# Patient Record
Sex: Male | Born: 1945 | Race: White | Hispanic: No | Marital: Married | Smoking: Never smoker
Health system: Southern US, Community
[De-identification: ages and names within clinical notes are randomized; demographics above are authoritative.]

## PROBLEM LIST (undated history)

## (undated) DIAGNOSIS — E785 Hyperlipidemia, unspecified: Secondary | ICD-10-CM

## (undated) DIAGNOSIS — R079 Chest pain, unspecified: Secondary | ICD-10-CM

## (undated) DIAGNOSIS — Z978 Presence of other specified devices: Secondary | ICD-10-CM

## (undated) DIAGNOSIS — J189 Pneumonia, unspecified organism: Secondary | ICD-10-CM

## (undated) DIAGNOSIS — F909 Attention-deficit hyperactivity disorder, unspecified type: Secondary | ICD-10-CM

## (undated) DIAGNOSIS — Z960 Presence of urogenital implants: Secondary | ICD-10-CM

## (undated) DIAGNOSIS — I2510 Atherosclerotic heart disease of native coronary artery without angina pectoris: Secondary | ICD-10-CM

## (undated) HISTORY — DX: Hyperlipidemia, unspecified: E78.5

## (undated) HISTORY — PX: CARDIAC CATHETERIZATION: SHX172

## (undated) HISTORY — DX: Atherosclerotic heart disease of native coronary artery without angina pectoris: I25.10

## (undated) HISTORY — DX: Chest pain, unspecified: R07.9

## (undated) HISTORY — PX: HERNIA REPAIR: SHX51

## (undated) HISTORY — DX: Attention-deficit hyperactivity disorder, unspecified type: F90.9

---

## 2010-08-18 ENCOUNTER — Ambulatory Visit (HOSPITAL_COMMUNITY)
Admission: RE | Admit: 2010-08-18 | Discharge: 2010-08-18 | Payer: Self-pay | Source: Home / Self Care | Attending: General Surgery | Admitting: General Surgery

## 2011-10-30 ENCOUNTER — Observation Stay (HOSPITAL_COMMUNITY)
Admission: EM | Admit: 2011-10-30 | Discharge: 2011-11-01 | DRG: 124 | Disposition: A | Discharge status: Home / Self Care | Payer: BC Managed Care – PPO | Attending: Emergency Medicine | Admitting: Emergency Medicine

## 2011-10-30 ENCOUNTER — Other Ambulatory Visit: Payer: Self-pay

## 2011-10-30 ENCOUNTER — Emergency Department (HOSPITAL_COMMUNITY): Payer: BC Managed Care – PPO

## 2011-10-30 ENCOUNTER — Encounter (HOSPITAL_COMMUNITY): Payer: Self-pay | Admitting: *Deleted

## 2011-10-30 DIAGNOSIS — I200 Unstable angina: Secondary | ICD-10-CM | POA: Insufficient documentation

## 2011-10-30 DIAGNOSIS — I2510 Atherosclerotic heart disease of native coronary artery without angina pectoris: Principal | ICD-10-CM | POA: Diagnosis present

## 2011-10-30 DIAGNOSIS — Z8249 Family history of ischemic heart disease and other diseases of the circulatory system: Secondary | ICD-10-CM | POA: Insufficient documentation

## 2011-10-30 DIAGNOSIS — R079 Chest pain, unspecified: Secondary | ICD-10-CM | POA: Insufficient documentation

## 2011-10-30 DIAGNOSIS — Z79899 Other long term (current) drug therapy: Secondary | ICD-10-CM | POA: Insufficient documentation

## 2011-10-30 DIAGNOSIS — E785 Hyperlipidemia, unspecified: Secondary | ICD-10-CM | POA: Diagnosis present

## 2011-10-30 DIAGNOSIS — R0602 Shortness of breath: Secondary | ICD-10-CM | POA: Insufficient documentation

## 2011-10-30 LAB — DIFFERENTIAL
Basophils Relative: 1 % (ref 0–1)
Eosinophils Absolute: 0.2 10*3/uL (ref 0.0–0.7)
Monocytes Absolute: 0.5 10*3/uL (ref 0.1–1.0)
Monocytes Relative: 7 % (ref 3–12)

## 2011-10-30 LAB — CBC
HCT: 42.3 % (ref 39.0–52.0)
Hemoglobin: 14.8 g/dL (ref 13.0–17.0)
MCH: 29.8 pg (ref 26.0–34.0)
MCHC: 35 g/dL (ref 30.0–36.0)
MCV: 85.3 fL (ref 78.0–100.0)

## 2011-10-30 LAB — POCT I-STAT TROPONIN I

## 2011-10-30 LAB — CK TOTAL AND CKMB (NOT AT ARMC): Relative Index: 2.1 (ref 0.0–2.5)

## 2011-10-30 MED ORDER — NITROGLYCERIN 0.4 MG SL SUBL
0.4000 mg | SUBLINGUAL_TABLET | SUBLINGUAL | Status: DC | PRN
  Administered 2011-10-30 – 2011-10-31 (×2): 0.4 mg via SUBLINGUAL
  Filled 2011-10-30: qty 25
  Filled 2011-10-30: qty 75

## 2011-10-30 NOTE — ED Provider Notes (Signed)
History     CSN: [id number]  Arrival date & time 10/30/11  2233   First MD Initiated Contact with Patient 10/30/11 2259      Chief Complaint  Patient presents with  . Chest Pain     Patient is a 66 y.o. male presenting with chest pain. The history is provided by the patient.  Chest Pain The chest pain began 3 - 5 hours ago. Chest pain occurs constantly. The chest pain is improving. Associated with: nothing. The severity of the pain is moderate. The quality of the pain is described as pressure-like. The pain radiates to the upper back. Chest pain is worsened by certain positions. Primary symptoms include shortness of breath. Pertinent negatives for primary symptoms include no fever, no syncope, no abdominal pain, no nausea, no vomiting and no dizziness.   pt reports onset of chest pressure/SOB earlier No syncope/focal weakness.  No abd pain No diaphoresis/nausea/vomiting No h/o CAD No recent travel/surgery He is very active at baseline and never limited by chest pain He is a nonsmoker Family h/o CAD He takes meds for ADHD but other medical problems   PMH -ADHD  History reviewed. No pertinent past surgical history.  Family history - positive for CAD  History  Substance Use Topics  . Smoking status: Never Smoker   . Smokeless tobacco: Not on file  . Alcohol Use: No      Review of Systems  Constitutional: Negative for fever.  Respiratory: Positive for shortness of breath.   Cardiovascular: Positive for chest pain. Negative for syncope.  Gastrointestinal: Negative for nausea, vomiting and abdominal pain.  Neurological: Negative for dizziness.  All other systems reviewed and are negative.    Allergies  Flexeril  Home Medications   Current Outpatient Rx  Name Route Sig Dispense Refill  . ASPIRIN 81 MG PO CHEW Oral Chew 81 mg by mouth daily.    . METHYLPHENIDATE HCL ER (CD) 20 MG PO CPCR Oral Take 20 mg by mouth daily.    . ADULT MULTIVITAMIN W/MINERALS CH Oral  Take 1 tablet by mouth daily.    . OMEGA-3-ACID ETHYL ESTERS 1 G PO CAPS Oral Take 1 g by mouth daily.      BP 122/76  Pulse 57  Temp(Src) 97.7 F (36.5 C) (Oral)  Resp 11  SpO2 100%  Physical Exam CONSTITUTIONAL: Well developed/well nourished HEAD AND FACE: Normocephalic/atraumatic EYES: EOMI/PERRL ENMT: Mucous membranes moist NECK: supple no meningeal signs SPINE:entire spine nontender CV: S1/S2 noted, no murmurs/rubs/gallops noted LUNGS: Lungs are clear to auscultation bilaterally, no apparent distress ABDOMEN: soft, nontender, no rebound or guarding GU:no cva tenderness NEURO: Pt is awake/alert, moves all extremitiesx4 EXTREMITIES: pulses normal, full ROM SKIN: warm, color normal PSYCH: no abnormalities of mood noted  ED Course  Procedures    Labs Reviewed  CBC  DIFFERENTIAL  POCT I-STAT TROPONIN I  CK TOTAL AND CKMB   11:32 PM Pt with chest pressure that is improving but still present He has already taken ASA today Will give NTG and reassess Vitals to be repeated  12:22 AM Repeat EKG shows no significant change  12:40 AM Pt reports pain improved with one dose of NTG He appears well  EKG nondiagnostic Initial troponin negative Did not report pleuritic type pain, doubt PE/dissection Will place in CDU obs for plan to Stress in AM Pt agreeable  MDM  Nursing notes reviewed and considered in documentation All labs/vitals reviewed and considered xrays reviewed and considered  Date: 10/30/2011  Rate: 65  Rhythm: normal sinus rhythm  QRS Axis: left  Intervals: normal  ST/T Wave abnormalities: nonspecific ST changes  Conduction Disutrbances:none Poor quality/artifact noted   Joya Gaskins, MD 10/31/11 [phone]

## 2011-10-30 NOTE — ED Notes (Signed)
The pt has had mid-chest pain  For 3 hours with some sob.  He says it feels like a weight pressing on his chest..  No previous history

## 2011-10-31 ENCOUNTER — Observation Stay (HOSPITAL_COMMUNITY): Payer: BC Managed Care – PPO

## 2011-10-31 ENCOUNTER — Encounter (HOSPITAL_COMMUNITY)
Admission: EM | Disposition: A | Discharge status: Home / Self Care | Payer: Self-pay | Source: Home / Self Care | Attending: Emergency Medicine

## 2011-10-31 ENCOUNTER — Other Ambulatory Visit: Payer: Self-pay

## 2011-10-31 DIAGNOSIS — I2510 Atherosclerotic heart disease of native coronary artery without angina pectoris: Secondary | ICD-10-CM | POA: Diagnosis present

## 2011-10-31 HISTORY — PX: LEFT HEART CATHETERIZATION WITH CORONARY ANGIOGRAM: SHX5451

## 2011-10-31 LAB — BASIC METABOLIC PANEL
CO2: 22 mEq/L (ref 19–32)
GFR calc non Af Amer: 89 mL/min — ABNORMAL LOW (ref >90)
Glucose, Bld: 116 mg/dL — ABNORMAL HIGH (ref 70–99)
Potassium: 4.2 mEq/L (ref 3.5–5.1)
Sodium: 139 mEq/L (ref 135–145)

## 2011-10-31 LAB — POCT I-STAT TROPONIN I: Troponin i, poc: 0 ng/mL (ref 0.00–0.08)

## 2011-10-31 SURGERY — LEFT HEART CATHETERIZATION WITH CORONARY ANGIOGRAM
Anesthesia: LOCAL

## 2011-10-31 MED ORDER — SODIUM CHLORIDE 0.9 % IV SOLN: 1.0000 mL/kg/h | INTRAVENOUS | Status: DC

## 2011-10-31 MED ORDER — IOHEXOL 350 MG/ML SOLN
80.0000 mL | Freq: Once | INTRAVENOUS | Status: AC | PRN
  Administered 2011-10-31: 80 mL via INTRAVENOUS

## 2011-10-31 MED ORDER — NITROGLYCERIN 0.4 MG SL SUBL
SUBLINGUAL_TABLET | SUBLINGUAL | Status: AC
  Filled 2011-10-31: qty 25

## 2011-10-31 MED ORDER — METHYLPHENIDATE HCL ER (CD) 20 MG PO CPCR
20.0000 mg | ORAL_CAPSULE | Freq: Every day | ORAL | Status: DC
  Filled 2011-10-31 (×2): qty 1

## 2011-10-31 MED ORDER — SODIUM CHLORIDE 0.9 % IV SOLN: INTRAVENOUS | Status: AC

## 2011-10-31 MED ORDER — SODIUM CHLORIDE 0.9 % IJ SOLN
3.0000 mL | Freq: Two times a day (BID) | INTRAMUSCULAR | Status: DC
  Administered 2011-10-31: 3 mL via INTRAVENOUS

## 2011-10-31 MED ORDER — ONDANSETRON HCL 4 MG/2ML IJ SOLN: 4.0000 mg | Freq: Four times a day (QID) | INTRAMUSCULAR | Status: DC | PRN

## 2011-10-31 MED ORDER — ADULT MULTIVITAMIN W/MINERALS CH
1.0000 | ORAL_TABLET | Freq: Every day | ORAL | Status: DC
  Administered 2011-10-31 – 2011-11-01 (×2): 1 via ORAL
  Filled 2011-10-31 (×2): qty 1

## 2011-10-31 MED ORDER — SODIUM CHLORIDE 0.9 % IV SOLN: INTRAVENOUS | Status: DC

## 2011-10-31 MED ORDER — ATORVASTATIN CALCIUM 20 MG PO TABS
20.0000 mg | ORAL_TABLET | Freq: Every day | ORAL | Status: DC
  Filled 2011-10-31: qty 1

## 2011-10-31 MED ORDER — MIDAZOLAM HCL 2 MG/2ML IJ SOLN
INTRAMUSCULAR | Status: AC
  Filled 2011-10-31: qty 2

## 2011-10-31 MED ORDER — ASPIRIN EC 325 MG PO TBEC
325.0000 mg | DELAYED_RELEASE_TABLET | Freq: Every day | ORAL | Status: DC
  Administered 2011-11-01: 325 mg via ORAL
  Filled 2011-10-31: qty 1

## 2011-10-31 MED ORDER — METOPROLOL TARTRATE 1 MG/ML IV SOLN
INTRAVENOUS | Status: AC
  Filled 2011-10-31: qty 10

## 2011-10-31 MED ORDER — ALPRAZOLAM 0.25 MG PO TABS: 0.2500 mg | ORAL_TABLET | Freq: Two times a day (BID) | ORAL | Status: DC | PRN

## 2011-10-31 MED ORDER — CLOPIDOGREL BISULFATE 75 MG PO TABS
150.0000 mg | ORAL_TABLET | Freq: Once | ORAL | Status: AC
Start: 2011-10-31 — End: 2011-10-31
  Administered 2011-10-31: 150 mg via ORAL
  Filled 2011-10-31: qty 2

## 2011-10-31 MED ORDER — METOPROLOL TARTRATE 1 MG/ML IV SOLN
2.5000 mg | Freq: Once | INTRAVENOUS | Status: AC
  Administered 2011-10-31: 2.5 mg via INTRAVENOUS

## 2011-10-31 MED ORDER — DIPHENHYDRAMINE HCL 25 MG PO CAPS
25.0000 mg | ORAL_CAPSULE | Freq: Once | ORAL | Status: AC
  Administered 2011-10-31: 25 mg via ORAL
  Filled 2011-10-31: qty 1

## 2011-10-31 MED ORDER — FENTANYL CITRATE 0.05 MG/ML IJ SOLN
INTRAMUSCULAR | Status: AC
  Filled 2011-10-31: qty 2

## 2011-10-31 MED ORDER — SODIUM CHLORIDE 0.9 % IJ SOLN: 3.0000 mL | INTRAMUSCULAR | Status: DC | PRN

## 2011-10-31 MED ORDER — ACETAMINOPHEN 325 MG PO TABS: 650.0000 mg | ORAL_TABLET | ORAL | Status: DC | PRN

## 2011-10-31 MED ORDER — OMEGA-3-ACID ETHYL ESTERS 1 G PO CAPS
1.0000 g | ORAL_CAPSULE | Freq: Every day | ORAL | Status: DC
  Administered 2011-10-31 – 2011-11-01 (×2): 1 g via ORAL
  Filled 2011-10-31 (×2): qty 1

## 2011-10-31 MED ORDER — ASPIRIN 81 MG PO CHEW: 324.0000 mg | CHEWABLE_TABLET | Freq: Once | ORAL | Status: DC

## 2011-10-31 MED ORDER — CLOPIDOGREL BISULFATE 75 MG PO TABS
75.0000 mg | ORAL_TABLET | Freq: Every day | ORAL | Status: DC
  Administered 2011-11-01: 75 mg via ORAL
  Filled 2011-10-31: qty 1

## 2011-10-31 MED ORDER — NITROGLYCERIN 0.4 MG SL SUBL
0.4000 mg | SUBLINGUAL_TABLET | Freq: Once | SUBLINGUAL | Status: AC
  Administered 2011-10-31: 0.4 mg via SUBLINGUAL

## 2011-10-31 MED ORDER — ASPIRIN 81 MG PO CHEW: 324.0000 mg | CHEWABLE_TABLET | ORAL | Status: DC

## 2011-10-31 MED ORDER — ASPIRIN 81 MG PO CHEW
CHEWABLE_TABLET | ORAL | Status: AC
  Filled 2011-10-31: qty 4

## 2011-10-31 MED ORDER — SODIUM CHLORIDE 0.9 % IV SOLN
20.0000 mL | INTRAVENOUS | Status: DC
  Administered 2011-10-31: 20 mL via INTRAVENOUS

## 2011-10-31 MED ORDER — SODIUM CHLORIDE 0.9 % IV SOLN: 250.0000 mL | INTRAVENOUS | Status: DC | PRN

## 2011-10-31 NOTE — ED Notes (Signed)
IN TO SPEAK WITH PT AND HIS WIFE. THEY ARE WANTING TO KNOW WHEN HE WILL BE DISCHARGED. SPENT SOME TIME AT BEDSIDE EXPLAINING WE ARE WAITING FOR CARDIOLOGY TO CALL BACK . REINFORCED PT DOES HAVE SOME BLOCKAGES FOUND ON HIS CT AND WE ARE WANTING TO PROVIDE THE SAFEST CARE FOR PT. THEY VERBALIZE UNDERSTANDING. PT ALSO MEDICATED FOR A "FUNNY FEELING" IN HIS CHEST. STATES "I JUST KNOW ITS THERE".

## 2011-10-31 NOTE — ED Notes (Signed)
DR TALBOT HAS CALLED REPORT ON PT. PT HAS SIGNIFICANT DISEASE IN LAD. AND WILL REQUIRE CARDIOLOGY CONSULT.DR Effie Shy MADE AWARE

## 2011-10-31 NOTE — ED Notes (Signed)
Pt states he took a quantity of two, 325 mg ASA PTA. Discontinued order for 324 mg chewable ASA.

## 2011-10-31 NOTE — H&P (Signed)
Terry Snyder is a 66 y.o. male  Admit date: 10/30/2011 Referring Physician: Dr. Beverley Fiedler at [facility] Primary Cardiologist:: Gwynneth Albright, M.D. Chief complaint / reason for admission: Chest pressure  HPI: The patient is a very pleasant 66 year old gentleman with no significant post factors for coronary disease other than family history. His mother several uncles and lives and his grandfather all had premature atherosclerosis. He is an active gentleman. He plays tennis several times per week. Last evening he suddenly developed a pressure-like sensation in the substernal area. It waxed and waned. He eventually came to the emergency room where the initial EKG was unremarkable. Sublingual nitroglycerin completely resolved the discomfort. The discomfort recurreed on 2 other occasions while in the emergency room and each time nitroglycerin promptly relieved it. He was observed in the CDU where cardiac markers were negative. A CT coronary angiogram was performed demonstrating a calcium score of 385, moderate soft plaque in the mid LAD greater than and equal to 50%, and a least 50% in the proximal segment of the RCA. The patient's electrocardiogram reveals poor R. wave progression V1 through V4. Several sets of cardiac markers have been negative.    PMH:   History reviewed. No pertinent past medical history.  PSH:   History reviewed. No pertinent past surgical history.  ALLERGIES:   Flexeril  Prior to Admit Meds:   Prescriptions prior to admission  Medication Sig Dispense Refill  . aspirin 81 MG chewable tablet Chew 81 mg by mouth daily.      . methylphenidate (METADATE CD) 20 MG CR capsule Take 20 mg by mouth daily.      . Multiple Vitamin (MULITIVITAMIN WITH MINERALS) TABS Take 1 tablet by mouth daily.      Marland Kitchen omega-3 acid ethyl esters (LOVAZA) 1 G capsule Take 1 g by mouth daily.       Family HX:   History reviewed. No pertinent family history. Social HX:    History   Social  History  . Marital Status: Married    Spouse Name: N/A    Number of Children: N/A  . Years of Education: N/A   Occupational History  . Not on file.   Social History Main Topics  . Smoking status: Never Smoker   . Smokeless tobacco: Not on file  . Alcohol Use: No  . Drug Use:   . Sexually Active:    Other Topics Concern  . Not on file   Social History Narrative  . No narrative on file     ROS: The patient denies history of coronary disease. He is active with no physical limitations. He denies asthma, COPD, smoking, diabetes, hypertension, and peripheral vascular disease. No history of gastrointestinal disease. He denies reflux. No prior stroke or transient neurological symptoms.  Physical Exam: Blood pressure 124/80, pulse 56, temperature 97.7 F (36.5 C), temperature source Oral, resp. rate 19, height 6\' 5"  (1.956 m), weight 89.812 kg (198 lb), SpO2 98.00%.    The patient is well-developed and well-nourished. He is in no acute distress.  The skin is of normal color. There is no diaphoresis. No evidence of cyanosis is noted.  The neck exam reveals no JVD or carotid bruits.  The chest is clear to auscultation and percussion.  Cardiac exam is unremarkable. No murmur, rub, click, or gallop is heard.  Abdomen is soft. No bruits are heard. Liver and spleen not palpable.  Extremities reveal no edema. Radial pulses are 2+ and symmetric.  Posterior tibials are 2+. Femorals are 2+ and symmetric.  Neurological exam is intact. Labs:   Lab Results  Component Value Date   WBC 7.3 10/30/2011   HGB 14.8 10/30/2011   HCT 42.3 10/30/2011   MCV 85.3 10/30/2011   PLT 205 10/30/2011    Lab 10/30/11 2246  NA 139  K 4.2  CL 104  CO2 22  BUN 20  CREATININE 0.89  CALCIUM 8.9  PROT --  BILITOT --  ALKPHOS --  ALT --  AST --  GLUCOSE 116*   Lab Results  Component Value Date   CKTOTAL 160 10/30/2011   CKMB 3.4 10/30/2011     Radiology:  No acute abnormality noted. EKG:  Poor  R. wave progression. Normal sinus rhythm.  ASSESSMENT:   1. Unstable angina pectoris, no evidence of myocardial necrosis. Documentation of coronary disease by CT angio with both LAD and RCA calcium and soft plaque.  Plan:  1. After review of the patient's clinical data and examining him, I believe coronary angiography is the preferred next step. His history is compatible with unstable angina with multiple episodes of chest pain relieved by sublingual nitroglycerin. I spoke with him concerning the procedure including the risks. We discussed in depth, microinfarction, CABG, stroke, bleeding, allergy, ischemic limb, renal failure, among others. He is willing to proceed with this procedure to define anatomy and help guide therapy to her Lesleigh Noe 10/31/2011 3:26 PM

## 2011-10-31 NOTE — ED Provider Notes (Signed)
Medical screening examination/treatment/procedure(s) were performed by non-physician practitioner and as supervising physician I was immediately available for consultation/collaboration.  Flint Melter, MD 10/31/11 [phone]

## 2011-10-31 NOTE — ED Notes (Signed)
PT STATES THE SL NTG HAS DIMINISHED HIS PAIN. STATES IS BARELY THERE. DENIES SOB

## 2011-10-31 NOTE — ED Notes (Signed)
Pt did have # 2o in left ac. Was not previously documented. Site discontinued per pt request. Site asymptomatic.

## 2011-10-31 NOTE — ED Notes (Signed)
Spoke with dr Reche Dixon. No po metoprolol orders given . Will get pt to ct when staff ready for  pt

## 2011-10-31 NOTE — CV Procedure (Signed)
     Diagnostic Cardiac Catheterization Report  Terry Snyder  65 y.o.  male 03/18/1946  Procedure Date: 10/31/2011  Referring Physician: Dr. Landis Gandy Primary Cardiologist:: Wayne Both, III M.D.   PROCEDURE:  Left heart catheterization with selective coronary angiography, left ventriculogram.  INDICATIONS:  Unstable angina, abnormal CT angiogram with calcium a soft plaque in the LAD and RCA territory, and at least 3 episodes of chest pressure responsive to sublingual nitroglycerin  The risks, benefits, and details of the procedure were explained to the patient.  The patient verbalized understanding and wanted to proceed.  Informed written consent was obtained.  PROCEDURE TECHNIQUE:  After Xylocaine anesthesia a 5 French sheath was placed in the right radial artery with a single anterior needle wall stick.   Coronary angiography was done using a 5 [place] A2 MP, 5 [place] JR 4, and 5 [place] JL 3.5 catheters.  Left ventriculography was done using a 5 [place] A2 MP catheter.    CONTRAST:  Total of 125 cc.  COMPLICATIONS:  None.    HEMODYNAMICS:  Aortic pressure was 102/60 mmHg; LV pressure was 108/4 mmHg; LVEDP 13 mmHg.  There was no gradient between the left ventricle and aorta.    ANGIOGRAPHIC DATA:   The left main coronary artery is widely patent..  The left anterior descending artery is large transapical vessel. It gives origin to 2 diagonal branches. Moderate proximal and mid vessel plaquing is noted obstructing the vessel up to 50%. No high grade obstruction is noted..  The left circumflex artery is dominant. 5 obtuse marginal branches arise from this vessel. It also gives origin to the PDA. The first, third, and fifth obtuse marginals are the larger branches. The fifth obtuse marginal contains eccentric, hazy, 50-60% narrowing due to probable ruptured plaque. This vessel distribution is relatively small. TIMI grade 3 flow was noted..  The right coronary artery is  non-dominant/codominant with a large conus branch arising proximally followed by a small to moderate-sized vessel with tubular 50-70% mid vessel stenosis. The right coronary gives a terminal acute marginal branch to the right ventricle.Marland Kitchen  LEFT VENTRICULOGRAM:  Left ventricular angiogram was done in the 30 RAO projection and revealed normal left ventricular wall motion and systolic function with an estimated ejection fraction of 65 %.    IMPRESSIONS:  1. Unstable angina, likely due to plaque rupture in the fifth obtuse marginal branch. The vessel size and borderline obstruction suggested medical therapy should be the treatment of choice. 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 The right coronary could also be the source of the patient's pain. This vessel is also relatively small and is probably better treated with medical therapy.  2. Mild to moderate plaquing in the LAD proximal and mid segment  3. Normal LV function, with LVEF of 65%   RECOMMENDATION:  1. Risk factor modification including statin therapy, aspirin, and Plavix (30 days).  2. Sublingual nitroglycerin if recurrent chest pain.  3. Probable discharge home in 12-24 hours depending upon his clinical course.Marland Kitchen

## 2011-10-31 NOTE — ED Notes (Signed)
Pt has returned accompanied by rn from ct. Tolerated meds and procedure well.

## 2011-10-31 NOTE — ED Notes (Signed)
Report given to [place] and [place] RN in CDU.

## 2011-10-31 NOTE — ED Notes (Signed)
Pt states that his pressure has been relieved. Pt aware of nitro order and told to call if pressure returns or changes.

## 2011-10-31 NOTE — ED Provider Notes (Signed)
Patient in CDU under chest pain protocol.  Patient resting comfortably at present with family at bedside, but continues to report mild sub-sternal pressure.  CT coronary results reviewed and discussed with patient and with Dr. Effie Shy.  LAD with approximately 50% stenosis.  All vessels patent.  Will administer NTG for discomfort and consult with cardiology.  Jimmye Norman, NP 10/31/11 [phone]

## 2011-10-31 NOTE — ED Notes (Signed)
Dr Katrinka Blazing in the cath lab.

## 2011-10-31 NOTE — ED Provider Notes (Signed)
Patient has been seen and evaluated by Dr. Erskine Emery Caeson Filippi--patient will be taken to cath lab today.  Jimmye Norman, NP 10/31/11 1505

## 2011-10-31 NOTE — Progress Notes (Signed)
Observation review is complete. 

## 2011-10-31 NOTE — ED Provider Notes (Signed)
Medical screening examination/treatment/procedure(s) were performed by non-physician practitioner and as supervising physician I was immediately available for consultation/collaboration.  Flint Melter, MD 10/31/11 1721

## 2011-10-31 NOTE — ED Notes (Signed)
Pt states that he was having Chest pressure that started radiating to his back then at rest went away. Pt states pressure felt like it was a 3/10. Pt states some nausea and SOB with pressure. Pt took 324 mg of aspirin at home.

## 2011-10-31 NOTE — ED Notes (Signed)
NP IN TO DISCUSS TEST RESULTS WITH PT AND PLAN OF CARE

## 2011-11-01 ENCOUNTER — Encounter (HOSPITAL_COMMUNITY): Payer: Self-pay | Admitting: General Practice

## 2011-11-01 DIAGNOSIS — E785 Hyperlipidemia, unspecified: Secondary | ICD-10-CM | POA: Diagnosis present

## 2011-11-01 LAB — BASIC METABOLIC PANEL
BUN: 12 mg/dL (ref 6–23)
GFR calc Af Amer: 89 mL/min — ABNORMAL LOW (ref >90)
GFR calc non Af Amer: 77 mL/min — ABNORMAL LOW (ref >90)
Potassium: 3.9 mEq/L (ref 3.5–5.1)
Sodium: 140 mEq/L (ref 135–145)

## 2011-11-01 LAB — CARDIAC PANEL(CRET KIN+CKTOT+MB+TROPI): Troponin I: <0.3 ng/mL (ref <0.30)

## 2011-11-01 MED ORDER — ATORVASTATIN CALCIUM 20 MG PO TABS: 10.0000 mg | ORAL_TABLET | Freq: Every day | ORAL | Status: AC

## 2011-11-01 MED ORDER — AMLODIPINE BESYLATE 5 MG PO TABS
5.0000 mg | ORAL_TABLET | Freq: Every day | ORAL | Status: DC
  Filled 2011-11-01: qty 1

## 2011-11-01 MED ORDER — AMLODIPINE BESYLATE 5 MG PO TABS: 5.0000 mg | ORAL_TABLET | Freq: Every day | ORAL | Status: AC

## 2011-11-01 MED ORDER — ASPIRIN 325 MG PO TBEC: 325.0000 mg | DELAYED_RELEASE_TABLET | Freq: Every day | ORAL | Status: AC

## 2011-11-01 MED ORDER — NITROGLYCERIN 0.4 MG SL SUBL: 0.4000 mg | SUBLINGUAL_TABLET | SUBLINGUAL | Status: AC | PRN

## 2011-11-01 MED ORDER — CLOPIDOGREL BISULFATE 75 MG PO TABS: 75.0000 mg | ORAL_TABLET | Freq: Every day | ORAL | Status: AC

## 2011-11-01 MED ORDER — OMEGA-3-ACID ETHYL ESTERS 1 G PO CAPS: 1.0000 g | ORAL_CAPSULE | Freq: Two times a day (BID) | ORAL | Status: AC

## 2011-11-01 NOTE — Progress Notes (Signed)
D/c instructions reviewed with pt and wife by RN Iran Kievit [place]. Pt and family verbalized understanding of all instructions. Education provided. Pt instructed to call Dr Michaelle Copas office if symptoms that he had this am return. Pt given copy of instructions and scripts with norvasc script was called into pt's pharmacy by Dr Katrinka Blazing. Pt d/c'd via wheelchair with belongings with wife escorted by hospital volunteer.

## 2011-11-01 NOTE — Progress Notes (Signed)
CARDIAC REHAB PHASE I   PRE:  Rate/Rhythm: 61 SR    BP: sitting 156/90 left, 146/90 right    SaO2:   MODE:  Ambulation: 600 ft   POST:  Rate/Rhythm: 79 SR    BP: sitting 164/86 left, after 20 min 142/94 right     SaO2:   BP elevated. Pt sts it has been running low in hospital and that it has never been high. Took multiple times with manual cuff. Apparently all his previous BPs have been with automatic. Tolerated walk well. Ed completed.  [id number]  Harriet Masson CES, ACSM

## 2011-11-01 NOTE — Progress Notes (Signed)
Dr Katrinka Blazing returned call, informed of pt symptoms and resolving with few minutes of onset. Continues plan of d/c home, pt will go home with norvasc, and pt to call Dr Michaelle Copas office if symptoms return. Pt informed of plan.

## 2011-11-01 NOTE — Therapy (Signed)
Clarification of patient status called to Dr. Jacky Kindle at 7:43am, inpatient status per Dr. Jacky Kindle received at 12:46pm

## 2011-11-01 NOTE — Discharge Summary (Addendum)
Patient ID: Terry Snyder MRN: [id number] DOB/AGE: 03/10/1946 66 y.o.  Admit date: 10/30/2011 Discharge date: 11/01/2011  Primary Discharge Diagnosis: Unstable angina, clinically resolved Secondary Discharge Diagnosis: Hyperlipidemia  Significant Diagnostic Studies: Coronary angiography  Consults: None  Hospital Course: The patient presented with substernal chest pressure that was intermittent in nature with waxing and waning severity. Total duration greater than 90 minutes. He was placed on the cardiac decision unit for cardiac markers and EKGs were normal. He has several recurring episodes of chest discomfort that were relieved with sublingual nitroglycerin. He had a coronary CTA that demonstrated an elevated calcium score and nonobstructive plaque in the LAD and right coronary.  After evaluation, coronary angiography was performed by Dr. Wayne Both, III. Angiography demonstrated mild to moderate proximal to mid LAD disease, moderate to moderately severe mid RCA disease, and 50-60% somewhat hazy fifth obtuse marginal branch obstruction felt to represent the culprit for the patient's presentation. Because of the size of the vessel territory and TIMI grade 3 flow, medical therapy was felt to be the optimal management strategy.  Statin therapy, Plavix, and high-dose aspirin was administered. The patient ambulated without difficulty. There were no post cath abnormalities. He was discharged home in improved condition to  Discharge Exam: Blood pressure 130/80, pulse 59, temperature 97.8 F (36.6 C), temperature source Oral, resp. rate 20, height 6' (1.829 m), weight 86.183 kg (190 lb), SpO2 93.00%.   The right radial cath site is unremarkable. There no neurological deficits appear Labs:   Lab Results  Component Value Date   WBC 7.3 10/30/2011   HGB 14.8 10/30/2011   HCT 42.3 10/30/2011   MCV 85.3 10/30/2011   PLT 205 10/30/2011     Lab 11/01/11 0500  NA 140  K 3.9  CL 105  CO2  28  BUN 12  CREATININE 1.01  CALCIUM 8.8  PROT --  BILITOT --  ALKPHOS --  ALT --  AST --  GLUCOSE 93   Lab Results  Component Value Date   CKTOTAL 91 11/01/2011   CKMB 2.2 11/01/2011   TROPONINI <0.30 11/01/2011    Radiology:  CHEST - 2 VIEW  Comparison: None.  Findings: Heart size is normal.  No pleural effusion or pulmonary edema no airspace consolidation is  identified.  Slightly coarsened interstitial markings are noted bilaterally.  The visualized osseous structures are unremarkable.  IMPRESSION:  1. No evidence for heart failure or pneumonia.  2. Chronic interstitial coarsening.  Original Report Authenticated By: Simonne Martinet   CORONARY CT ANGIO: IMPRESSION:  1. Age advanced, possibly obstructive coronary artery disease.  The patient's total coronary artery calcium score is 385, which is  82nd percentile for patient's matched age and gender.  2. Extensive plaque within the LAD. A mixed calcified and  noncalcified proximal plaque has stenosis which may be greater than  50%. The nondominant right coronary artery is suboptimally  evaluated. A calcified plaque within its proximal portion also may  be obstructive. Other primarily calcified lesions, as detailed  above, not felt to be obstructive.  3. Left-sided coronary artery dominance.  4. Ascending aorta upper normal to minimally dilated, as detailed  above.  Report was called to Drinda Butts, r.n. at 10:57 am on 10/31/2011.  Original Report Authenticated By: Consuello Bossier, M.D.  EKG: Poor R wave progression, normal sinus rhythm. FOLLOW UP PLANS AND APPOINTMENTS  Medication List  As of 11/01/2011 11:35 AM   STOP taking these medications  aspirin 81 MG chewable tablet         TAKE these medications         amLODipine 5 MG tablet   Commonly known as: NORVASC   Take 1 tablet (5 mg total) by mouth daily.      aspirin 325 MG EC tablet   Take 1 tablet (325 mg total) by mouth daily.      atorvastatin 20 MG  tablet   Commonly known as: LIPITOR   Take 0.5 tablets (10 mg total) by mouth daily at 6 PM.      clopidogrel 75 MG tablet   Commonly known as: PLAVIX   Take 1 tablet (75 mg total) by mouth daily with breakfast.      methylphenidate 20 MG CR capsule   Commonly known as: METADATE CD   Take 20 mg by mouth daily.      mulitivitamin with minerals Tabs   Take 1 tablet by mouth daily.      nitroGLYCERIN 0.4 MG SL tablet   Commonly known as: NITROSTAT   Place 1 tablet (0.4 mg total) under the tongue every 5 (five) minutes as needed for chest pain.      omega-3 acid ethyl esters 1 G capsule   Commonly known as: LOVAZA   Take 1 capsule (1 g total) by mouth 2 (two) times daily.           Follow-up Information    Follow up with Lesleigh Noe, MD on 11/09/2011. (9:45 AM)    Contact information:   [address] [place] [address] [phone]          BRING ALL MEDICATIONS WITH YOU TO FOLLOW UP APPOINTMENTS  Time spent with patient to include physician time: 30 min. Signed: Lesleigh Noe 11/01/2011, 11:35 AM

## 2011-11-01 NOTE — Progress Notes (Signed)
Pt called with c/o of feeling mildly dizzy, tingling in arms and clammy hands.  RN noted clammy hands, manual BP of 130/80 right arm.  Pt states Dr. Katrinka Blazing was just in 2 minutes prior to this occurring.  Dr Katrinka Blazing paged, return call from cath lab staff where Dr Katrinka Blazing is at this time. ( Also in need of d/c instructions to be reconciled with new add med), cath lab staff member will relay info to Dr Katrinka Blazing.   Symptoms pt was having subsided within a few minutes of it starting. Will await Dr Katrinka Blazing to advise of pt's symptoms and discharge.

## 2011-11-01 NOTE — Discharge Instructions (Signed)
Call if recurrent chest pain or need to use NTG.

## 2013-03-26 IMAGING — CT CT HEART MORP W/ CTA COR W/ SCORE W/ CA W/CM &/OR W/O CM
1 series · 7 of 9 positions shown, 9 images · IV contrast (CONTRAST)
Comparison: Plain film chest of 1 day prior.  No prior CT.

INDICATION: Chest pressure radiating to back.  Nausea and
shortness of breath.

CT ANGIOGRAPHY OF THE HEART, CORONARY ARTERY, STRUCTURE, AND
MORPHOLOGY
CONTRAST: 80mL OMNIPAQUE IOHEXOL 350 MG/ML IV SOLN
TECHNIQUE: CT angiography of the coronary vessels was performed on
a 256 channel system using prospective ECG gating.  A scout and
noncontrast exam (for calcium scoring) were performed.  Circulation
time was measured using a test bolus.  Coronary CTA was performed
with sub mm slice collimation during portions of the cardiac cycle
after prior injection of iodinated contrast.  Imaging post
processing was performed on an independent workstation creating
multiplanar and 3-D images, and quantitative analysis of the heart
and coronary arteries.  Note that this exam targets the heart and
the chest was not imaged in its entirety.
PREMEDICATION:
Lopressor 2.5 mg, IV
Nitroglycerin 0.4 mcg, sublingual.

[Series 301: display · axial · 0.31mm/px · z∈[-170,-107]mm · 7 of 9 slices shown, 9 images]
[im 2/9  vessel]
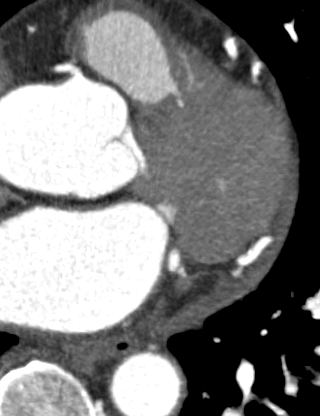
[im 2/9  lung]
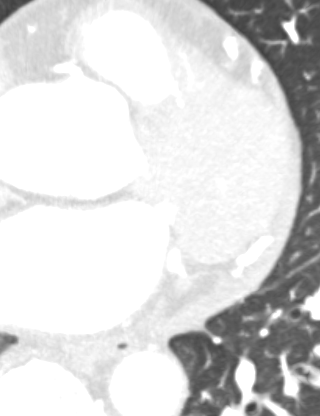
[im 3/9  vessel]
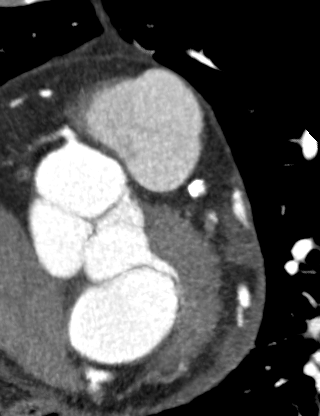
[im 4/9  vessel]
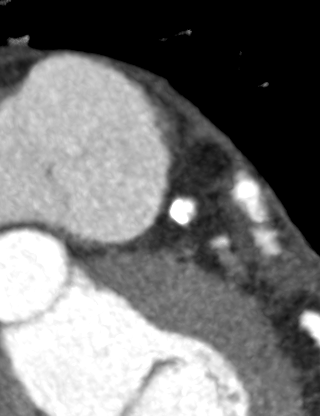
[im 5/9  vessel]
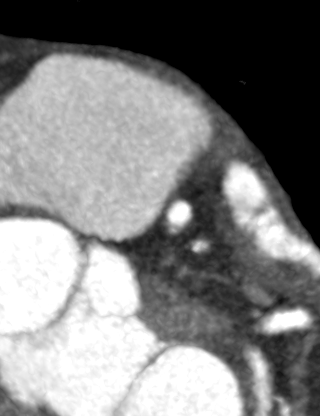
[im 6/9  vessel]
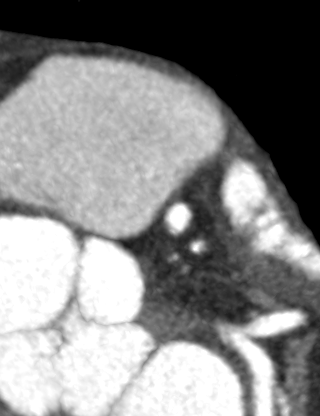
[im 6/9  lung]
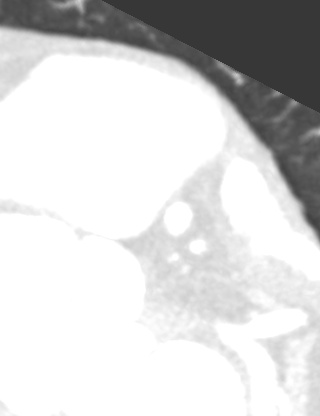
[im 7/9  vessel]
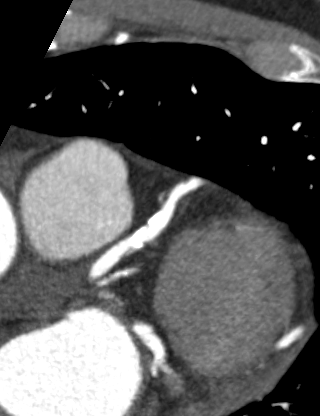
[im 8/9  vessel]
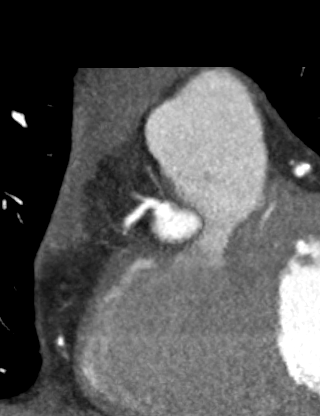

[7 of 9 positions shown; findings below may reference images not displayed]

FINDINGS: Technical quality:  Good to excellent.

Heart rate:  51

CORONARY ARTERIES:
Left main coronary artery:  A short vessel, which arises from the
left coronary cusp, and is without significant disease.
Left anterior descending:  Gives rise to a diminutive, patent first
diagonal.  Centered just proximal to this first diagonal are 2
adjacent lesions.  The more proximal lesion is mixed, calcified and
noncalcified..  This causes a possibly greater than 50% stenosis
(see short axis images in PACS).  The lumen narrows from
approximately 5 mm to just greater than 2 mm.  Immediately distal
to this, a calcified lesion with mild positive remodeling causes an
approximately 30% luminal narrowing.

Just distal to the first diagonal takeoff, foci of calcified
nonobstructive plaque are identified.

LAD gives rise to a moderate-sized second diagonal, which is
branching and without significant stenosis proximally.  Just distal
to this second diagonal takeoff, a calcified plaque causes
approximately 30% stenosis.  The LAD continues as a moderate length
vessel, which is patent distally.

Left circumflex:  Immediately gives rise to a patent first
marginal, which functions as a ramus branch.  Is a large, dominant
vessel.  Gives rise to a second marginal which has minimal
nonobstructive calcified and noncalcified plaque within.  This is
just distal to its bifurcation.  After at least one other
bifurcation, there is calcified nonobstructive plaque more distally
within this patent vessel.

Proximal to the origin of the branching large marginal, minimal
calcified circumflex plaque is identified, without significant
stenosis.  Circ gives rise to a smaller marginal and continues to
supply a second larger marginal, which is patent and supplies a
large portion of the left ventricular free wall.  Continues
distally to supply the posterolateral left ventricular branches and
a moderate sized PDA.
Right coronary artery:  Moderate sized, nondominant vessel.  Arises
from the right coronary cusp.  Immediately gives rise to a conus
branch.  Suboptimally evaluated, secondary to mild motion artifact.
An at least partially calcified plaque approximately 3 cm from the
origin.  Cannot exclude obstructive/greater 50% stenosis.  The
distal vessel is diminutive but patent.
Posterior descending artery:  Patent.
Dominance:  Left-sided

CORONARY CALCIUM:
Total Agatston Score:
[HOSPITAL] percentile:  82nd

AORTA AND PULMONARY MEASUREMENTS:
Aortic root (21 - 40 mm):
            27  at the annulus
            41  at the sinuses of Valsalva
            33  at the sinotubular junction
Ascending aorta ( <  40 mm):  39
Descending aorta ( <  40 mm):  22
Main pulmonary artery:  ( <  30 mm):  28

EXTRACARDIAC FINDINGS:
Lung windows demonstrate no airspace opacities.

Soft tissue windows demonstrate minimal pericardial thickening or
fluid.  No pleural effusions. No central pulmonary embolism, on
this non-dedicated study.  No evidence of aortic dissection.  No
imaged thoracic adenopathy.

Limited abdominal imaging demonstrates no significant findings.  No
acute osseous abnormality.
IMPRESSION: 1.  Age advanced, possibly obstructive coronary artery disease.
The patient's total coronary artery calcium score is 385, which is
82nd percentile for patient's matched age and gender.
2.  Extensive plaque within the LAD.  A mixed calcified and
noncalcified proximal plaque has stenosis which may be greater than
50%.  The nondominant right coronary artery is suboptimally
evaluated.  A calcified plaque within its proximal portion also may
be obstructive.  Other primarily calcified lesions, as detailed
above, not felt to be obstructive.
3.  Left-sided coronary artery dominance.
4.  Ascending aorta upper normal to minimally dilated, as detailed
above.

## 2013-06-17 ENCOUNTER — Encounter (HOSPITAL_COMMUNITY): Payer: Self-pay | Admitting: Emergency Medicine

## 2013-06-17 ENCOUNTER — Emergency Department (HOSPITAL_COMMUNITY)
Admission: EM | Admit: 2013-06-17 | Discharge: 2013-06-17 | Disposition: A | Discharge status: Home / Self Care | Payer: Commercial Managed Care - PPO | Attending: Emergency Medicine | Admitting: Emergency Medicine

## 2013-06-17 DIAGNOSIS — Y93A1 Activity, exercise machines primarily for cardiorespiratory conditioning: Secondary | ICD-10-CM | POA: Insufficient documentation

## 2013-06-17 DIAGNOSIS — I2510 Atherosclerotic heart disease of native coronary artery without angina pectoris: Secondary | ICD-10-CM | POA: Insufficient documentation

## 2013-06-17 DIAGNOSIS — Z79899 Other long term (current) drug therapy: Secondary | ICD-10-CM | POA: Insufficient documentation

## 2013-06-17 DIAGNOSIS — I4892 Unspecified atrial flutter: Secondary | ICD-10-CM

## 2013-06-17 DIAGNOSIS — E785 Hyperlipidemia, unspecified: Secondary | ICD-10-CM | POA: Insufficient documentation

## 2013-06-17 DIAGNOSIS — X500XXA Overexertion from strenuous movement or load, initial encounter: Secondary | ICD-10-CM | POA: Insufficient documentation

## 2013-06-17 DIAGNOSIS — R002 Palpitations: Secondary | ICD-10-CM | POA: Insufficient documentation

## 2013-06-17 DIAGNOSIS — F909 Attention-deficit hyperactivity disorder, unspecified type: Secondary | ICD-10-CM | POA: Insufficient documentation

## 2013-06-17 DIAGNOSIS — Y9229 Other specified public building as the place of occurrence of the external cause: Secondary | ICD-10-CM | POA: Insufficient documentation

## 2013-06-17 DIAGNOSIS — Z7982 Long term (current) use of aspirin: Secondary | ICD-10-CM | POA: Insufficient documentation

## 2013-06-17 DIAGNOSIS — Z888 Allergy status to other drugs, medicaments and biological substances status: Secondary | ICD-10-CM | POA: Insufficient documentation

## 2013-06-17 DIAGNOSIS — R42 Dizziness and giddiness: Secondary | ICD-10-CM | POA: Insufficient documentation

## 2013-06-17 LAB — POCT I-STAT TROPONIN I

## 2013-06-17 LAB — CBC WITH DIFFERENTIAL/PLATELET
Basophils Relative: 0 % (ref 0–1)
Eosinophils Absolute: 0.1 10*3/uL (ref 0.0–0.7)
Eosinophils Relative: 1 % (ref 0–5)
Hemoglobin: 15.4 g/dL (ref 13.0–17.0)
MCH: 30.3 pg (ref 26.0–34.0)
MCHC: 35.6 g/dL (ref 30.0–36.0)
MCV: 85.1 fL (ref 78.0–100.0)
Monocytes Absolute: 0.7 10*3/uL (ref 0.1–1.0)
Monocytes Relative: 8 % (ref 3–12)
Neutrophils Relative %: 63 % (ref 43–77)

## 2013-06-17 LAB — BASIC METABOLIC PANEL
BUN: 14 mg/dL (ref 6–23)
Calcium: 9.3 mg/dL (ref 8.4–10.5)
Creatinine, Ser: 1.02 mg/dL (ref 0.50–1.35)
GFR calc Af Amer: 87 mL/min — ABNORMAL LOW (ref >90)
GFR calc non Af Amer: 75 mL/min — ABNORMAL LOW (ref >90)
Potassium: 3.9 mEq/L (ref 3.5–5.1)

## 2013-06-17 LAB — PROTIME-INR: Prothrombin Time: 13 seconds (ref 11.6–15.2)

## 2013-06-17 MED ORDER — DILTIAZEM HCL 25 MG/5ML IV SOLN
10.0000 mg | Freq: Once | INTRAVENOUS | Status: AC
  Administered 2013-06-17: 10 mg via INTRAVENOUS
  Filled 2013-06-17: qty 5

## 2013-06-17 MED ORDER — DILTIAZEM HCL 25 MG/5ML IV SOLN: 20.0000 mg | Freq: Once | INTRAVENOUS | Status: DC

## 2013-06-17 MED ORDER — DILTIAZEM HCL ER COATED BEADS 120 MG PO CP24: 120.0000 mg | ORAL_CAPSULE | Freq: Every day | ORAL | Status: DC

## 2013-06-17 NOTE — ED Notes (Signed)
Patient states that he was working out and found his heart rate was in the 150's.   He stated that he was feeling lightheaded and dizzy.  Patient continues with the dizziness.  Patient denies any chest pain or shortness of breath.  In triage heart rate is 142 in triage.

## 2013-06-17 NOTE — ED Provider Notes (Signed)
CSN: [id number]     Arrival date & time 06/17/13  2011 History   First MD Initiated Contact with Patient 06/17/13 2023     Chief Complaint  Patient presents with  . Tachycardia   (Consider location/radiation/quality/duration/timing/severity/associated sxs/prior Treatment) The history is provided by the patient.  Terry Snyder is a 67 y.o. male here presenting with palpations. He'll working out in [employer] and was on the treadmill. After he finished treadmill he found that his heart was in the 150s. No feel lightheaded and dizzy. He is rechecked at home and was 140s at home. No history of arrhythmias in the past and no CAD. No diabetes and never smoker.    Past Medical History  Diagnosis Date  . No pertinent past medical history    Past Surgical History  Procedure Laterality Date  . Hernia repair     History reviewed. No pertinent family history. History  Substance Use Topics  . Smoking status: Never Smoker   . Smokeless tobacco: Never Used  . Alcohol Use: Yes     Comment: occasional    Review of Systems  Cardiovascular: Positive for palpitations.  Neurological: Positive for dizziness.  All other systems reviewed and are negative.    Allergies  Flexeril  Home Medications   Current Outpatient Rx  Name  Route  Sig  Dispense  Refill  . aspirin 81 MG tablet   Oral   Take 81 mg by mouth daily.         . Multiple Vitamin (MULITIVITAMIN WITH MINERALS) TABS   Oral   Take 1 tablet by mouth daily.         . naproxen sodium (ANAPROX) 220 MG tablet   Oral   Take 220 mg by mouth daily as needed. For back pain         . omega-3 acid ethyl esters (LOVAZA) 1 G capsule   Oral   Take 1 capsule (1 g total) by mouth 2 (two) times daily.          BP 118/85  Pulse 59  Temp(Src) 97.9 F (36.6 C) (Oral)  Resp 18  Ht 6' (1.829 m)  Wt 195 lb 4.8 oz (88.587 kg)  BMI 26.48 kg/m2  SpO2 95% Physical Exam  Nursing note and vitals reviewed. Constitutional: He is  oriented to person, place, and time. He appears well-developed and well-nourished.  HENT:  Head: Normocephalic.  Mouth/Throat: Oropharynx is clear and moist.  Eyes: Conjunctivae are normal. Pupils are equal, round, and reactive to light.  Neck: Normal range of motion.  Cardiovascular: Normal heart sounds.   Tachycardic, irregular   Pulmonary/Chest: Effort normal and breath sounds normal. No respiratory distress. He has no wheezes. He has no rales.  Abdominal: Soft. Bowel sounds are normal. He exhibits no distension. There is no tenderness. There is no rebound.  Musculoskeletal: Normal range of motion. He exhibits no edema and no tenderness.  Neurological: He is alert and oriented to person, place, and time.  Skin: Skin is warm and dry.  Psychiatric: He has a normal mood and affect. His behavior is normal. Judgment and thought content normal.    ED Course  Procedures (including critical care time) Labs Review Labs Reviewed  BASIC METABOLIC PANEL - Abnormal; Notable for the following:    Glucose, Bld 102 (*)    GFR calc non Af Amer 75 (*)    GFR calc Af Amer 87 (*)    All other components within normal limits  CBC  WITH DIFFERENTIAL  PROTIME-INR  POCT I-STAT TROPONIN I   Imaging Review No results found.      EKG Interpretation     Ventricular Rate:  60 PR Interval:  172 QRS Duration: 80 QT Interval:  440 QTC Calculation: 440 R Axis:   -61 Text Interpretation:  Sinus rhythm Probable left atrial enlargement RSR' in V1 or V2, right VCD or RVH Inferior infarct, old changed from atrial flutter to sinus rhythm            MDM  No diagnosis found. Terry Snyder is a 67 y.o. male here with dizziness, tachycardia. EKG with atrial flutter. Will give cardizem and reassess. [place] 2 score low (only 1 for age) so no need for anticoagulation.   10:03 PM After 10 mg cardizem, converted back to sinus. I called Dr. Terressa Koyanagi, cardiology, who recommend cardizem cd 120 mg daily  and cardiology f/u.     Richardean Canal, MD 06/17/13 2203

## 2013-06-18 ENCOUNTER — Encounter: Payer: Self-pay | Admitting: Interventional Cardiology

## 2013-06-18 ENCOUNTER — Encounter: Payer: Self-pay | Admitting: *Deleted

## 2013-06-19 ENCOUNTER — Ambulatory Visit (INDEPENDENT_AMBULATORY_CARE_PROVIDER_SITE_OTHER): Payer: BC Managed Care – PPO | Admitting: Interventional Cardiology

## 2013-06-19 ENCOUNTER — Encounter: Payer: Self-pay | Admitting: Interventional Cardiology

## 2013-06-19 VITALS — BP 130/84 | HR 54 | Ht 72.0 in | Wt 194.0 lb

## 2013-06-19 DIAGNOSIS — I2510 Atherosclerotic heart disease of native coronary artery without angina pectoris: Secondary | ICD-10-CM

## 2013-06-19 DIAGNOSIS — R030 Elevated blood-pressure reading, without diagnosis of hypertension: Secondary | ICD-10-CM

## 2013-06-19 DIAGNOSIS — E785 Hyperlipidemia, unspecified: Secondary | ICD-10-CM

## 2013-06-19 DIAGNOSIS — I4892 Unspecified atrial flutter: Secondary | ICD-10-CM | POA: Insufficient documentation

## 2013-06-19 NOTE — Progress Notes (Signed)
Patient ID: Terry Snyder, male   DOB: June 07, 1946, 67 y.o.   MRN: [id number]    [address], [address] [place], [place]  [address] Phone: [phone] Fax:  [phone]  Date:  06/19/2013   ID:  Terry Snyder, DOB 10/07/1945, MRN [id number]  PCP:  Darrow Bussing, MD   ASSESSMENT:  1. Recent self terminating atrial flutter with 21 AV conduction 2. Coronary artery disease presenting as ACS with moderate distal circumflex obstructive disease by catheterization in 2013 3. Hyperlipidemia  PLAN:  1. 2-D Doppler echocardiogram 2. Continue current medical regimen 3. Prolonged conversation concerning the natural history, and treatment options were atrial flutter. We also talked about prognosis. Multiple questions were answered. Counseling significantly increase the visit time to greater than 40 minutes.   SUBJECTIVE: Terry Snyder is a 67 y.o. male is doing okay today. He was seen in the emergency room after he developed tachycardia on Monday. EKG demonstrated a narrow complex heart rate/rhythm at 150 beats per minute and was felt to be atrial flutter with 2-1 AV conduction. He was started on IV diltiazem and had spontaneous conversion. He was sent home from the emergency room on diltiazem 120 mg (CD preparation). He's had no recurrence of the palpitations. He denies chest pain. Energy level is been good. He is purposefully avoided significant physical activity. He plays tennis regularly and has abstained. He had no chest discomfort with the tachycardia. He does have moderate distal coronary disease in the circumflex territory is been under medical treatment now for greater than a year without symptoms.   Wt Readings from Last 3 Encounters:  06/19/13 194 lb (87.998 kg)  06/17/13 195 lb 4.8 oz (88.587 kg)  10/31/11 190 lb (86.183 kg)     Past Medical History  Diagnosis Date  . Hyperlipidemia   . Chest pain   . CAD (coronary artery disease)   . ADHD (attention deficit  hyperactivity disorder)     Current Outpatient Prescriptions  Medication Sig Dispense Refill  . aspirin 81 MG tablet Take 81 mg by mouth daily.      Marland Kitchen diltiazem (CARDIZEM CD) 120 MG 24 hr capsule Take 1 capsule (120 mg total) by mouth daily.  20 capsule  0  . doxylamine, Sleep, (UNISOM) 25 MG tablet Take 25 mg by mouth at bedtime as needed. Take 1/2 tab at bed-time      . Multiple Vitamin (MULITIVITAMIN WITH MINERALS) TABS Take 1 tablet by mouth daily.      . naproxen sodium (ANAPROX) 220 MG tablet Take 220 mg by mouth daily as needed. For back pain      . omega-3 acid ethyl esters (LOVAZA) 1 G capsule Take 1 capsule (1 g total) by mouth 2 (two) times daily.       No current facility-administered medications for this visit.    Allergies:    Allergies  Allergen Reactions  . Flexeril [Cyclobenzaprine Hcl] Other (See Comments)    Dizziness with all muscle relaxers    Social History:  The patient  reports that he has never smoked. He has never used smokeless tobacco. He reports that he drinks alcohol. He reports that he does not use illicit drugs.   ROS:  Please see the history of present illness.   No medication side effects. Denies syncope. No recurrence of palpitations.    All other systems reviewed and negative.   OBJECTIVE: VS:  BP 130/84  Pulse 54  Ht 6' (1.829 m)  Wt 194  lb (87.998 kg)  BMI 26.31 kg/m2 Well nourished, well developed, in no acute distress, younger than stated age and comfortable HEENT: normal Neck: JVD flat. Carotid bruit absent  Cardiac:  normal S1, S2; RRR; no murmur Lungs:  clear to auscultation bilaterally, no wheezing, rhonchi or rales Abd: soft, nontender, no hepatomegaly Ext: Edema  None . Pulses 2+ and symmetric  Skin: warm and dry Neuro:  CNs 2-12 intact, no focal abnormalities noted  EKG:  Normal sinus rhythm with sinus bradycardia and left anterior hemiblock       Signed, Darci Needle III, MD 06/19/2013 11:27 AM

## 2013-06-19 NOTE — Patient Instructions (Signed)
Your physician has requested that you have an echocardiogram. Echocardiography is a painless test that uses sound waves to create images of your heart. It provides your doctor with information about the size and shape of your heart and how well your heart's chambers and valves are working. This procedure takes approximately one hour. There are no restrictions for this procedure.  Ok to resume your normal activity  Follow up pending echo results

## 2013-07-03 ENCOUNTER — Encounter: Payer: Self-pay | Admitting: Cardiovascular Disease

## 2013-07-03 ENCOUNTER — Encounter (HOSPITAL_COMMUNITY): Payer: Self-pay | Admitting: Interventional Cardiology

## 2013-07-03 ENCOUNTER — Ambulatory Visit (HOSPITAL_COMMUNITY): Payer: Commercial Managed Care - PPO | Attending: Cardiovascular Disease | Admitting: Cardiology

## 2013-07-03 DIAGNOSIS — I359 Nonrheumatic aortic valve disorder, unspecified: Secondary | ICD-10-CM | POA: Insufficient documentation

## 2013-07-03 DIAGNOSIS — E785 Hyperlipidemia, unspecified: Secondary | ICD-10-CM | POA: Insufficient documentation

## 2013-07-03 DIAGNOSIS — I4892 Unspecified atrial flutter: Secondary | ICD-10-CM | POA: Insufficient documentation

## 2013-07-03 DIAGNOSIS — I2510 Atherosclerotic heart disease of native coronary artery without angina pectoris: Secondary | ICD-10-CM | POA: Insufficient documentation

## 2013-07-03 NOTE — Progress Notes (Signed)
Echo performed. 

## 2013-07-05 ENCOUNTER — Telehealth: Payer: Self-pay

## 2013-07-05 NOTE — Telephone Encounter (Signed)
Message copied by Jarvis Newcomer on Fri Jul 05, 2013  3:50 PM ------      Message from: Verdis Prime      Created: Fri Jul 05, 2013  1:53 PM       Mildly dilated left atrium. This is not a major problem. Heart function is normal. ------

## 2013-07-05 NOTE — Telephone Encounter (Signed)
pt wife  given results of echo.  Mildly dilated left atrium. This is not a major problem. Heart function is normal. pt wife verbalized understanding

## 2014-07-24 ENCOUNTER — Encounter (HOSPITAL_COMMUNITY): Payer: Self-pay | Admitting: Interventional Cardiology

## 2014-08-26 ENCOUNTER — Other Ambulatory Visit: Payer: Self-pay | Admitting: Family Medicine

## 2014-08-26 ENCOUNTER — Ambulatory Visit
Admission: RE | Admit: 2014-08-26 | Discharge: 2014-08-26 | Disposition: A | Discharge status: Home / Self Care | Payer: Commercial Managed Care - PPO | Source: Ambulatory Visit | Attending: Family Medicine | Admitting: Family Medicine

## 2014-08-26 DIAGNOSIS — J189 Pneumonia, unspecified organism: Secondary | ICD-10-CM

## 2014-08-26 DIAGNOSIS — J209 Acute bronchitis, unspecified: Secondary | ICD-10-CM

## 2014-08-26 HISTORY — DX: Pneumonia, unspecified organism: J18.9

## 2015-03-05 ENCOUNTER — Telehealth: Payer: Self-pay

## 2015-03-05 NOTE — Telephone Encounter (Signed)
Follow Up ° ° °Pt is calling following up on call from earlier. Please call. °

## 2015-03-05 NOTE — Telephone Encounter (Signed)
Received phone call from Dr. Glendale Chard office, spoke with Janett Billow, stating pt has had symtpoms of SOB, palpitations, with a history of CAD.  Pt was asked to come into their office yesterday and he had a funeral to attend.  Pt called their office today and left a message stating he has an appt. in our office on 8/15 and he does not feel it is soon enough for his symptoms and Dr. Docia Chuck agreed.   In reviewing pts chart he has not been seen since 06/2013 and with his of A. Flutter and CAD and only takes ASA 81 Qdaily.   LM on pt home and work phone number to get more details and see if can come in with appt to be evaluated sooner than current appt.    Nita [phone] Ext: [phone]

## 2015-03-05 NOTE — Telephone Encounter (Signed)
Pt stated that he had an event of A. Flutter in 8 - 9 months ago with HR 150 went to ED and converted.   On Monday night this week pt experienced same thing with SOB and palpitations and HR 130 but it only lasted a few hours, he took a ASA 81 and went to bed feeling was gone by morning.  Pt stated that PCP, Dr. Docia Chuck, wanted pt to be seen sooner than current appt on 8/15.   Reviewed Dr. Michaelle Copas schedule and no availabilities.  Spoke with DOD, Mayford Knife, recommended pt be seen in A. Fib clinic.  Called pt LM to call back and can schedule in A. Fib clinic 7/26 or 7/27.  Did not cancel pt 8/15 appt in case he needs it for follow up can be cancelled during appt. With Everlena Cooper if not needed. Called pt PCP, left message with Janett Billow, to make them aware.

## 2015-03-10 ENCOUNTER — Encounter (HOSPITAL_COMMUNITY): Payer: Self-pay | Admitting: Nurse Practitioner

## 2015-03-10 ENCOUNTER — Ambulatory Visit (HOSPITAL_COMMUNITY)
Admission: RE | Admit: 2015-03-10 | Discharge: 2015-03-10 | Disposition: A | Discharge status: Home / Self Care | Payer: Commercial Managed Care - PPO | Source: Ambulatory Visit | Attending: Nurse Practitioner | Admitting: Nurse Practitioner

## 2015-03-10 VITALS — BP 140/72 | HR 63 | Ht 72.0 in | Wt 191.8 lb

## 2015-03-10 DIAGNOSIS — I2510 Atherosclerotic heart disease of native coronary artery without angina pectoris: Secondary | ICD-10-CM | POA: Diagnosis present

## 2015-03-10 DIAGNOSIS — I4892 Unspecified atrial flutter: Secondary | ICD-10-CM | POA: Diagnosis not present

## 2015-03-10 MED ORDER — DILTIAZEM HCL 30 MG PO TABS: ORAL_TABLET | ORAL | Status: DC

## 2015-03-10 NOTE — Patient Instructions (Signed)
Your physician has recommended you make the following change in your medication:  1)Cardizem  -- take 1 tablet every 4 hours AS NEEDED for elevated heart rate.   Follow up in Afib clinic as needed [phone]

## 2015-03-11 ENCOUNTER — Encounter (HOSPITAL_COMMUNITY): Payer: Self-pay | Admitting: Nurse Practitioner

## 2015-03-11 NOTE — Progress Notes (Signed)
Patient ID: Terry Snyder, male   DOB: 10-21-1945, 69 y.o.   MRN: [id number]     Primary Care Physician: Terry Bussing, MD Referring Physician: Venetia Snyder    Terry Snyder is a 69 y.o. male with a h/o aflutter 2:1 at 144, x one episode documented 11/14 in the ER. Pt converted quickley with IV diltiazem. He was given diltiazem 120 mg to take daily but hasn't taken for years, without any further episodes. He is in the afib clinic today to be further evaluated for a episode of fast heart the other evening. It was shortly before bed, he was aware of a irregular heart beat but no shortness of breath or dizziness. He took 2 asa and went to bed and when he work up the next morning, he was in regular rhythm. No further episodes. He has a chadsvasc score of 0. He does not use alcohol, no snoring per wife, no tobacco use. He exercises on a regular basis and is not overwight. No excessive caffeine use. Last echo in 2014 revealed normal  EF with mild LVH. Last atrium was mildly dilated.  Today, he denies symptoms of palpitations, chest pain, shortness of breath, orthopnea, PND, lower extremity edema, dizziness, presyncope, syncope, or neurologic sequela. The patient is tolerating medications without difficulties and is otherwise without complaint today.   Past Medical History  Diagnosis Date  . Hyperlipidemia   . Chest pain   . CAD (coronary artery disease)   . ADHD (attention deficit hyperactivity disorder)    Past Surgical History  Procedure Laterality Date  . Hernia repair    . Cardiac catheterization      Unstable angina- cath-EF 65percent, plaque rupture 5th obtuse marginal, fixed stenosis non dominant right CA, mild plaquing LAD-medical therpay recommended, Dr. Katrinka Blazing, 3/13  . Left heart catheterization with coronary angiogram N/A 10/31/2011    Procedure: LEFT HEART CATHETERIZATION WITH CORONARY ANGIOGRAM;  Surgeon: Lesleigh Noe, MD;  Location: [facility] CATH LAB;  Service: Cardiovascular;   Laterality: N/A;    Current Outpatient Prescriptions  Medication Sig Dispense Refill  . aspirin 81 MG tablet Take 81 mg by mouth daily.    Marland Kitchen doxylamine, Sleep, (UNISOM) 25 MG tablet Take 25 mg by mouth at bedtime as needed. Take 1/2 tab at bed-time    . Multiple Vitamin (MULITIVITAMIN WITH MINERALS) TABS Take 1 tablet by mouth daily.    . naproxen sodium (ANAPROX) 220 MG tablet Take 220 mg by mouth daily as needed. For back pain    . omega-3 acid ethyl esters (LOVAZA) 1 G capsule Take 1 capsule (1 g total) by mouth 2 (two) times daily.    Marland Kitchen diltiazem (CARDIZEM) 30 MG tablet Take 1 tablet every 4 hours AS NEEDED for elevated HR. 30 tablet 0   No current facility-administered medications for this encounter.    Allergies  Allergen Reactions  . Flexeril [Cyclobenzaprine Hcl] Other (See Comments)    Dizziness with all muscle relaxers    History   Social History  . Marital Status: Married    Spouse Name: N/A  . Number of Children: N/A  . Years of Education: N/A   Occupational History  . Not on file.   Social History Main Topics  . Smoking status: Never Smoker   . Smokeless tobacco: Never Used  . Alcohol Use: Yes     Comment: occasional  . Drug Use: No  . Sexual Activity: Yes   Other Topics Concern  . Not on file  Social History Narrative    No family history on file.  ROS- All systems are reviewed and negative except as per the HPI above  Physical Exam: Filed Vitals:   03/10/15 1521  BP: 140/72  Pulse: 63  Height: 6' (1.829 m)  Weight: 191 lb 12.8 oz (87 kg)    GEN- The patient is well appearing, alert and oriented x 3 today.   Head- normocephalic, atraumatic Eyes-  Sclera clear, conjunctiva pink Ears- hearing intact Oropharynx- clear Neck- supple, no JVP Lymph- no cervical lymphadenopathy Lungs- Clear to ausculation bilaterally, normal work of breathing Heart- Regular rate and rhythm, no murmurs, rubs or gallops, PMI not laterally displaced GI- soft,  NT, ND, + BS Extremities- no clubbing, cyanosis, or edema MS- no significant deformity or atrophy Skin- no rash or lesion Psych- euthymic mood, full affect Neuro- strength and sensation are intact  EKG- NSR at 63 bpm, LAFB,unchanged from previous, Pr int 162 ms, QRS int 78 ms, QTC . EKG from 11/14 shows typical aflutter.   Assessment and Plan: 1. Paroxysmal A. Flutter Episodes have been infrequent ( only 2) over last two years, both episodes were around 2 hours. He is minimally symptomatic with episodes Has no lifestyle triggers to address Chadsvasc score is 0, no indication for blood thinners, but pt takes baby asa 81 mg a day. For now will prescribe Cardizem 30 mg to use one every 4-6 hours as needed for fast heart rate.  If reoccurs on a more frequent basis then he would be a good candidate for aflutter ablation.   F/u with Dr. Katrinka Blazing or afib clinic as needed

## 2015-03-19 ENCOUNTER — Telehealth: Payer: Self-pay | Admitting: Physician Assistant

## 2015-03-19 NOTE — Telephone Encounter (Signed)
03/30/2015 Received medical records from [facility] at [place] for upcoming appointment with Wilburt Finlay, PA.  Records given to [facility].

## 2015-03-27 NOTE — Telephone Encounter (Signed)
DO NOT NEED °

## 2015-03-30 ENCOUNTER — Ambulatory Visit: Payer: Commercial Managed Care - PPO | Admitting: Physician Assistant

## 2015-09-17 ENCOUNTER — Other Ambulatory Visit: Payer: Self-pay | Admitting: Urology

## 2015-09-21 NOTE — Patient Instructions (Addendum)
Terry Snyder  09/21/2015   Your procedure is scheduled on: 09-25-15  Report to [facility] Main  Entrance take [facility]  elevators to 3rd floor to  Short Stay Center at 1015 AM.  Call this number if you have problems the morning of surgery [phone]   Remember: ONLY 1 PERSON MAY GO WITH YOU TO SHORT STAY TO GET  READY MORNING OF YOUR SURGERY.  Do not eat food or drink liquids :After Midnight.     Take these medicines the morning of surgery with A SIP OF WATER: finasteride (proscar), diltiazem if needed             You may not have any metal on your body including hair pins and              piercings  Do not wear jewelry, make-up, lotions, powders or perfumes, deodorant             Do not wear nail polish.  Do not shave  48 hours prior to surgery.              Men may shave face and neck.   Do not bring valuables to the hospital. [place] IS NOT             RESPONSIBLE   FOR VALUABLES.  Contacts, dentures or bridgework may not be worn into surgery.  Leave suitcase in the car. After surgery it may be brought to your room.                 Please read over the following fact sheets you were given: _____________________________________________________________________             [facility] - Preparing for Surgery Before surgery, you can play an important role.  Because skin is not sterile, your skin needs to be as free of germs as possible.  You can reduce the number of germs on your skin by washing with CHG (chlorahexidine gluconate) soap before surgery.  CHG is an antiseptic cleaner which kills germs and bonds with the skin to continue killing germs even after washing. Please DO NOT use if you have an allergy to CHG or antibacterial soaps.  If your skin becomes reddened/irritated stop using the CHG and inform your nurse when you arrive at Short Stay. Do not shave (including legs and underarms) for at least 48 hours prior to the first CHG shower.  You may  shave your face/neck. Please follow these instructions carefully:  1.  Shower with CHG Soap the night before surgery and the  morning of Surgery.  2.  If you choose to wash your hair, wash your hair first as usual with your  normal  shampoo.  3.  After you shampoo, rinse your hair and body thoroughly to remove the  shampoo.                           4.  Use CHG as you would any other liquid soap.  You can apply chg directly  to the skin and wash                       Gently with a scrungie or clean washcloth.  5.  Apply the CHG Soap to your body ONLY FROM THE NECK DOWN.   Do not use on face/ open  Wound or open sores. Avoid contact with eyes, ears mouth and genitals (private parts).                       Wash face,  Genitals (private parts) with your normal soap.             6.  Wash thoroughly, paying special attention to the area where your surgery  will be performed.  7.  Thoroughly rinse your body with warm water from the neck down.  8.  DO NOT shower/wash with your normal soap after using and rinsing off  the CHG Soap.                9.  Pat yourself dry with a clean towel.            10.  Wear clean pajamas.            11.  Place clean sheets on your bed the night of your first shower and do not  sleep with pets. Day of Surgery : Do not apply any lotions/deodorants the morning of surgery.  Please wear clean clothes to the hospital/surgery center.  FAILURE TO FOLLOW THESE INSTRUCTIONS MAY RESULT IN THE CANCELLATION OF YOUR SURGERY PATIENT SIGNATURE_________________________________  NURSE SIGNATURE__________________________________  ________________________________________________________________________

## 2015-09-22 ENCOUNTER — Encounter (HOSPITAL_COMMUNITY): Payer: Self-pay

## 2015-09-22 ENCOUNTER — Encounter (HOSPITAL_COMMUNITY)
Admission: RE | Admit: 2015-09-22 | Discharge: 2015-09-22 | Disposition: A | Discharge status: Home / Self Care | Payer: Medicare Other | Source: Ambulatory Visit | Attending: Urology | Admitting: Urology

## 2015-09-22 DIAGNOSIS — I2510 Atherosclerotic heart disease of native coronary artery without angina pectoris: Secondary | ICD-10-CM | POA: Diagnosis not present

## 2015-09-22 DIAGNOSIS — Z79899 Other long term (current) drug therapy: Secondary | ICD-10-CM | POA: Diagnosis not present

## 2015-09-22 DIAGNOSIS — N3289 Other specified disorders of bladder: Secondary | ICD-10-CM | POA: Diagnosis not present

## 2015-09-22 DIAGNOSIS — E785 Hyperlipidemia, unspecified: Secondary | ICD-10-CM | POA: Diagnosis not present

## 2015-09-22 DIAGNOSIS — Z7982 Long term (current) use of aspirin: Secondary | ICD-10-CM | POA: Diagnosis not present

## 2015-09-22 DIAGNOSIS — N210 Calculus in bladder: Secondary | ICD-10-CM | POA: Diagnosis not present

## 2015-09-22 DIAGNOSIS — N401 Enlarged prostate with lower urinary tract symptoms: Secondary | ICD-10-CM | POA: Diagnosis not present

## 2015-09-22 DIAGNOSIS — R338 Other retention of urine: Secondary | ICD-10-CM | POA: Diagnosis not present

## 2015-09-22 DIAGNOSIS — N138 Other obstructive and reflux uropathy: Secondary | ICD-10-CM | POA: Diagnosis not present

## 2015-09-22 HISTORY — DX: Presence of other specified devices: Z97.8

## 2015-09-22 HISTORY — DX: Pneumonia, unspecified organism: J18.9

## 2015-09-22 HISTORY — DX: Presence of urogenital implants: Z96.0

## 2015-09-22 LAB — CBC
HEMATOCRIT: 43.4 % (ref 39.0–52.0)
HEMOGLOBIN: 14.2 g/dL (ref 13.0–17.0)
MCH: 29.2 pg (ref 26.0–34.0)
MCHC: 32.7 g/dL (ref 30.0–36.0)
MCV: 89.1 fL (ref 78.0–100.0)
Platelets: 268 10*3/uL (ref 150–400)
RBC: 4.87 MIL/uL (ref 4.22–5.81)
RDW: 13 % (ref 11.5–15.5)
WBC: 7.5 10*3/uL (ref 4.0–10.5)

## 2015-09-22 LAB — BASIC METABOLIC PANEL
ANION GAP: 9 (ref 5–15)
BUN: 20 mg/dL (ref 6–20)
CHLORIDE: 107 mmol/L (ref 101–111)
CO2: 27 mmol/L (ref 22–32)
Calcium: 9.5 mg/dL (ref 8.9–10.3)
Creatinine, Ser: 1.04 mg/dL (ref 0.61–1.24)
Glucose, Bld: 109 mg/dL — ABNORMAL HIGH (ref 65–99)
Potassium: 4.3 mmol/L (ref 3.5–5.1)
SODIUM: 143 mmol/L (ref 135–145)

## 2015-09-22 NOTE — Progress Notes (Signed)
Spoke with dr crews anesthesia and made aware chest xray results from 08-26-14, patient does not need chest xray with pre op visit today

## 2015-09-22 NOTE — Progress Notes (Signed)
ekg [phone] epic  cardio lov 03-10-15 epic

## 2015-09-25 ENCOUNTER — Ambulatory Visit (HOSPITAL_COMMUNITY): Payer: Medicare Other | Admitting: Certified Registered"

## 2015-09-25 ENCOUNTER — Ambulatory Visit (HOSPITAL_COMMUNITY)
Admission: RE | Admit: 2015-09-25 | Discharge: 2015-09-26 | Disposition: A | Discharge status: Home / Self Care | Payer: Medicare Other | Source: Ambulatory Visit | Attending: Urology | Admitting: Urology

## 2015-09-25 ENCOUNTER — Encounter (HOSPITAL_COMMUNITY)
Admission: RE | Disposition: A | Discharge status: Home / Self Care | Payer: Self-pay | Source: Ambulatory Visit | Attending: Urology

## 2015-09-25 ENCOUNTER — Encounter (HOSPITAL_COMMUNITY): Payer: Self-pay | Admitting: *Deleted

## 2015-09-25 DIAGNOSIS — N210 Calculus in bladder: Secondary | ICD-10-CM | POA: Insufficient documentation

## 2015-09-25 DIAGNOSIS — Z79899 Other long term (current) drug therapy: Secondary | ICD-10-CM | POA: Insufficient documentation

## 2015-09-25 DIAGNOSIS — N3289 Other specified disorders of bladder: Secondary | ICD-10-CM | POA: Diagnosis not present

## 2015-09-25 DIAGNOSIS — Z7982 Long term (current) use of aspirin: Secondary | ICD-10-CM | POA: Insufficient documentation

## 2015-09-25 DIAGNOSIS — N138 Other obstructive and reflux uropathy: Secondary | ICD-10-CM | POA: Diagnosis not present

## 2015-09-25 DIAGNOSIS — I2510 Atherosclerotic heart disease of native coronary artery without angina pectoris: Secondary | ICD-10-CM | POA: Insufficient documentation

## 2015-09-25 DIAGNOSIS — N401 Enlarged prostate with lower urinary tract symptoms: Secondary | ICD-10-CM | POA: Diagnosis present

## 2015-09-25 DIAGNOSIS — R338 Other retention of urine: Secondary | ICD-10-CM | POA: Insufficient documentation

## 2015-09-25 DIAGNOSIS — E785 Hyperlipidemia, unspecified: Secondary | ICD-10-CM | POA: Insufficient documentation

## 2015-09-25 HISTORY — PX: TRANSURETHRAL RESECTION OF PROSTATE: SHX73

## 2015-09-25 SURGERY — TURP (TRANSURETHRAL RESECTION OF PROSTATE)
Anesthesia: General

## 2015-09-25 MED ORDER — PHENAZOPYRIDINE HCL 200 MG PO TABS: 200.0000 mg | ORAL_TABLET | Freq: Three times a day (TID) | ORAL | Status: AC | PRN

## 2015-09-25 MED ORDER — PROPOFOL 10 MG/ML IV BOLUS
INTRAVENOUS | Status: AC
  Filled 2015-09-25: qty 20

## 2015-09-25 MED ORDER — LIDOCAINE HCL (CARDIAC) 20 MG/ML IV SOLN
INTRAVENOUS | Status: DC | PRN
  Administered 2015-09-25: 60 mg via INTRAVENOUS

## 2015-09-25 MED ORDER — HYDROCODONE-ACETAMINOPHEN 5-325 MG PO TABS: 1.0000 | ORAL_TABLET | ORAL | Status: DC | PRN

## 2015-09-25 MED ORDER — MIDAZOLAM HCL 5 MG/5ML IJ SOLN
INTRAMUSCULAR | Status: DC | PRN
  Administered 2015-09-25: 2 mg via INTRAVENOUS

## 2015-09-25 MED ORDER — SODIUM CHLORIDE 0.9 % IR SOLN
Status: DC | PRN
  Administered 2015-09-25: 24000 mL via INTRAVESICAL

## 2015-09-25 MED ORDER — DEXAMETHASONE SODIUM PHOSPHATE 10 MG/ML IJ SOLN
INTRAMUSCULAR | Status: AC
  Filled 2015-09-25: qty 1

## 2015-09-25 MED ORDER — LACTATED RINGERS IV SOLN
INTRAVENOUS | Status: DC | PRN
Start: 2015-09-25 — End: 2015-09-25
  Administered 2015-09-25: 12:00:00 via INTRAVENOUS

## 2015-09-25 MED ORDER — DEXAMETHASONE SODIUM PHOSPHATE 4 MG/ML IJ SOLN
INTRAMUSCULAR | Status: DC | PRN
  Administered 2015-09-25: 10 mg via INTRAVENOUS

## 2015-09-25 MED ORDER — HYDROMORPHONE HCL 1 MG/ML IJ SOLN: 0.5000 mg | INTRAMUSCULAR | Status: DC | PRN

## 2015-09-25 MED ORDER — LIDOCAINE HCL (CARDIAC) 20 MG/ML IV SOLN
INTRAVENOUS | Status: AC
  Filled 2015-09-25: qty 5

## 2015-09-25 MED ORDER — HYDROCODONE-ACETAMINOPHEN 10-325 MG PO TABS: 1.0000 | ORAL_TABLET | ORAL | Status: AC | PRN

## 2015-09-25 MED ORDER — ACETAMINOPHEN 500 MG PO TABS
1000.0000 mg | ORAL_TABLET | Freq: Four times a day (QID) | ORAL | Status: DC
  Administered 2015-09-25 – 2015-09-26 (×2): 1000 mg via ORAL
  Filled 2015-09-25 (×5): qty 2

## 2015-09-25 MED ORDER — SODIUM CHLORIDE 0.9 % IV SOLN
INTRAVENOUS | Status: DC
  Administered 2015-09-25: 17:00:00 via INTRAVENOUS

## 2015-09-25 MED ORDER — FENTANYL CITRATE (PF) 100 MCG/2ML IJ SOLN
INTRAMUSCULAR | Status: AC
  Filled 2015-09-25: qty 2

## 2015-09-25 MED ORDER — ONDANSETRON HCL 4 MG/2ML IJ SOLN
INTRAMUSCULAR | Status: AC
  Filled 2015-09-25: qty 2

## 2015-09-25 MED ORDER — FENTANYL CITRATE (PF) 100 MCG/2ML IJ SOLN
INTRAMUSCULAR | Status: DC | PRN
  Administered 2015-09-25 (×2): 25 ug via INTRAVENOUS
  Administered 2015-09-25: 50 ug via INTRAVENOUS
  Administered 2015-09-25 (×2): 25 ug via INTRAVENOUS

## 2015-09-25 MED ORDER — LEVOFLOXACIN IN D5W 500 MG/100ML IV SOLN
500.0000 mg | INTRAVENOUS | Status: DC
  Administered 2015-09-25: 500 mg via INTRAVENOUS
  Filled 2015-09-25 (×2): qty 100

## 2015-09-25 MED ORDER — ONDANSETRON HCL 4 MG/2ML IJ SOLN
INTRAMUSCULAR | Status: DC | PRN
  Administered 2015-09-25: 4 mg via INTRAVENOUS

## 2015-09-25 MED ORDER — LEVOFLOXACIN 500 MG PO TABS: 500.0000 mg | ORAL_TABLET | Freq: Every day | ORAL | Status: DC

## 2015-09-25 MED ORDER — MIDAZOLAM HCL 2 MG/2ML IJ SOLN
INTRAMUSCULAR | Status: AC
  Filled 2015-09-25: qty 2

## 2015-09-25 MED ORDER — STERILE WATER FOR IRRIGATION IR SOLN
Status: DC | PRN
  Administered 2015-09-25: 500 mL

## 2015-09-25 MED ORDER — PROPOFOL 10 MG/ML IV BOLUS
INTRAVENOUS | Status: DC | PRN
  Administered 2015-09-25: 160 mg via INTRAVENOUS

## 2015-09-25 MED ORDER — ACETAMINOPHEN 325 MG PO TABS: 650.0000 mg | ORAL_TABLET | ORAL | Status: DC | PRN

## 2015-09-25 MED ORDER — ZOLPIDEM TARTRATE 5 MG PO TABS
5.0000 mg | ORAL_TABLET | Freq: Every evening | ORAL | Status: DC | PRN
  Administered 2015-09-25: 5 mg via ORAL
  Filled 2015-09-25: qty 1

## 2015-09-25 MED ORDER — BELLADONNA ALKALOIDS-OPIUM 16.2-60 MG RE SUPP: 1.0000 | Freq: Four times a day (QID) | RECTAL | Status: DC | PRN

## 2015-09-25 MED ORDER — BACITRACIN-NEOMYCIN-POLYMYXIN 400-5-5000 EX OINT: 1.0000 "application " | TOPICAL_OINTMENT | Freq: Three times a day (TID) | CUTANEOUS | Status: DC | PRN

## 2015-09-25 MED ORDER — LEVOFLOXACIN IN D5W 500 MG/100ML IV SOLN
500.0000 mg | INTRAVENOUS | Status: DC
  Filled 2015-09-25: qty 100

## 2015-09-25 MED ORDER — 0.9 % SODIUM CHLORIDE (POUR BTL) OPTIME
TOPICAL | Status: DC | PRN
  Administered 2015-09-25: 1000 mL

## 2015-09-25 MED ORDER — ONDANSETRON HCL 4 MG/2ML IJ SOLN: 4.0000 mg | INTRAMUSCULAR | Status: DC | PRN

## 2015-09-25 SURGICAL SUPPLY — 22 items
BAG URINE DRAINAGE (UROLOGICAL SUPPLIES) ×1 IMPLANT
BAG URO CATCHER STRL LF (MISCELLANEOUS) ×2 IMPLANT
BLADE SURG 15 STRL LF DISP TIS (BLADE) IMPLANT
BLADE SURG 15 STRL SS (BLADE)
CATH FOLEY 3WAY 30CC 20FR (CATHETERS) ×1 IMPLANT
CATH FOLEY 3WAY 30CC 24FR (CATHETERS)
CATH URTH STD 24FR FL 3W 2 (CATHETERS) ×1 IMPLANT
ELECT REM PT RETURN 9FT ADLT (ELECTROSURGICAL) ×2
ELECTRODE REM PT RTRN 9FT ADLT (ELECTROSURGICAL) ×1 IMPLANT
EVACUATOR MICROVAS BLADDER (UROLOGICAL SUPPLIES) ×2 IMPLANT
GLOVE BIOGEL M 8.0 STRL (GLOVE) ×2 IMPLANT
GOWN STRL REUS W/ TWL XL LVL3 (GOWN DISPOSABLE) ×1 IMPLANT
GOWN STRL REUS W/TWL XL LVL3 (GOWN DISPOSABLE) ×4 IMPLANT
KIT ASPIRATION TUBING (SET/KITS/TRAYS/PACK) ×2 IMPLANT
LOOP CUT BIPOLAR 24F LRG (ELECTROSURGICAL) ×1 IMPLANT
MANIFOLD NEPTUNE II (INSTRUMENTS) ×2 IMPLANT
NS IRRIG 1000ML POUR BTL (IV SOLUTION) ×2 IMPLANT
PACK CYSTO (CUSTOM PROCEDURE TRAY) ×2 IMPLANT
SUT ETHILON 3 0 PS 1 (SUTURE) IMPLANT
SYR 30ML LL (SYRINGE) ×1 IMPLANT
TUBING CONNECTING 10 (TUBING) ×2 IMPLANT
WIRE COONS/BENSON .038X145CM (WIRE) IMPLANT

## 2015-09-25 NOTE — Progress Notes (Signed)
Night of surgery note  Subjective: The patient is doing well.  No complaints.  Objective: Vital signs in last 24 hours: Temp:  [97.3 F (36.3 C)-98 F (36.7 C)] 97.3 F (36.3 C) (02/10 1533) Pulse Rate:  [51-59] 52 (02/10 1533) Resp:  [10-18] 13 (02/10 1533) BP: (114-132)/(74-84) 122/74 mmHg (02/10 1533) SpO2:  [99 %-100 %] 99 % (02/10 1533) Weight:  [86.637 kg (191 lb)] 86.637 kg (191 lb) (02/10 1036)  Intake/Output this shift: Total I/O In: 1300 [I.V.:800; Other:500] Out: 1450 [Urine:1450]  Physical Exam:  He is alert and in no distress Abdomen is soft and nontender Foley catheter is in place, on mild traction and draining slightly pink urine with no clots.  Lab Results: No results for input(s): HGB, HCT in the last 72 hours.  Assessment/Plan: 1) Continue to monitor 2) Per orders   Doriana Mazurkiewicz C. Vernie Ammons, MD  [facility] 09/25/2015, 4:35 PM

## 2015-09-25 NOTE — Discharge Instructions (Signed)

## 2015-09-25 NOTE — Anesthesia Procedure Notes (Signed)
Procedure Name: LMA Insertion Date/Time: 09/25/2015 12:34 PM Performed by: Renella Cunas D Pre-anesthesia Checklist: Patient identified, Emergency Drugs available, Suction available and Patient being monitored Patient Re-evaluated:Patient Re-evaluated prior to inductionOxygen Delivery Method: Circle System Utilized Preoxygenation: Pre-oxygenation with 100% oxygen Intubation Type: IV induction Ventilation: Mask ventilation without difficulty LMA: LMA inserted LMA Size: 4.0 Number of attempts: 1 Airway Equipment and Method: Bite block Placement Confirmation: positive ETCO2 Tube secured with: Tape Dental Injury: Teeth and Oropharynx as per pre-operative assessment

## 2015-09-25 NOTE — Transfer of Care (Signed)
Immediate Anesthesia Transfer of Care Note  Patient: Terry Snyder  Procedure(s) Performed: Procedure(s) (LRB): TRANSURETHRAL RESECTION OF THE PROSTATE (TURP) WITH GYRUS (N/A)  Patient Location: PACU  Anesthesia Type: General  Level of Consciousness: awake, oriented, sedated and patient cooperative  Airway & Oxygen Therapy: Patient Spontanous Breathing and Patient connected to face mask oxygen  Post-op Assessment: Report given to PACU RN and Post -op Vital signs reviewed and stable  Post vital signs: Reviewed and stable  Complications: No apparent anesthesia complications

## 2015-09-25 NOTE — H&P (Signed)
Terry Snyder is a 70 year old male who presents today for TURP.  Elevated PSA: In 8/09 his PSA was 3.5. Then in it had increased to 4.19 however he failed to return for a followup PSA evaluation.   PSA's:  1/13 - 3.5  5/14 - 3.9  2/16 - 5.33     BPH with bladder outlet obstruction - He has tried Flomax in the past but it caused dizziness and disorientation. He also has tried Avodart for about a year and a half but noted no change in his voiding pattern on this medication. He was primarily having urgency as his greatest complaint but also has noted decreased force in his urinary stream, some intermittency and slowing of his stream with some intermittency. He said the urgency now has diminished and he only gets up about once a night on average so this is not a particular bother for him at this time.   Current therapy: Cialis 2.5 mg daily    Interval Hx: When I saw him in 3/16 we discussed monitoring his PSA closely and he told me he got his PSA done for free at work so he would have it rechecked in 3 months and send me the results although I never received any PSA results. A recent PSA was obtained although he has been having difficulty with urinary retention and we therefore discussed the fact that this may have resulted in some elevation of the PSA. I would recommend rechecking once he is out of urinary retention.       Past Medical History Problems  1. History of No Medical Problems  Surgical History Problems  1. History of No Surgical Problems  Current Meds 1. Adult Aspirin Low Strength 81 MG TBDP;  Therapy: (Recorded:05Jul2011) to Recorded 2. Cialis TABS;  Therapy: (Recorded:29Mar2016) to Recorded 3. Daily Multiple Vitamins TABS;  Therapy: (Recorded:05Jul2011) to Recorded 4. Finasteride 5 MG Oral Tablet; TAKE 1 TABLET DAILY AS DIRECTED;  Therapy: 20Jan2017 to (Evaluate:18Aug2017)  Requested for: 20Jan2017; Last  Rx:20Jan2017 Ordered 5. Fish Oil 1000 MG Oral Capsule;  Therapy: (Recorded:24Jun2014) to Recorded 6. Lipitor TABS;  Therapy: (Recorded:29Mar2016) to Recorded  Allergies Medication  1. Flomax CP24 2. Rapaflo CAPS  Family History Problems  1. Family history of Death In The Family Father : Mother   73- brain tumor 2. Family history of Death In The Family Mother : Mother   15- stroke 3. Family history of Family Health Status Number Of Children : Mother   1 son 1 daughter 4. Family history of Heart Disease : Mother  Social History Problems  1. Denied: History of Alcohol Use (History) 2. Caffeine Use   3 3. Marital History - Currently Married 4. Never A Smoker 5. Occupation:   Designer, multimedia 6. Denied: History of Tobacco Use  Vitals Vital Signs  Height: 5 ft 11.5 in Weight: 190 lb  BMI Calculated: 26.13 BSA Calculated: 2.07 Blood Pressure: 115 / 64 Heart Rate: 62  Review of Systems Genitourinary, constitutional, skin, eye, otolaryngeal, hematologic/lymphatic, cardiovascular, pulmonary, endocrine, musculoskeletal, gastrointestinal, neurological and psychiatric system(s) were reviewed and pertinent findings if present are noted.  Genitourinary: feelings of urinary urgency, nocturia, difficulty starting the urinary stream, weak urinary stream, urinary stream starts and stops and post-void dribbling.  Musculoskeletal: joint pain.    Physical Exam Constitutional: Well nourished and well developed . No acute distress.  ENT:. The ears and nose are normal in appearance.  Neck: The appearance of the neck is normal and no neck mass is present.  Pulmonary: No respiratory distress and normal respiratory rhythm and effort.  Cardiovascular: Heart rate and rhythm are normal . No peripheral edema.  Abdomen: The abdomen is soft and nontender. No masses are palpated. No CVA tenderness. No hernias are palpable. No hepatosplenomegaly noted.  Rectal: Rectal exam demonstrates normal sphincter tone, no tenderness and no masses. Estimated  prostate size is 1+. There is obliteration of his median furrow. The prostate has no nodularity and is not tender. The left seminal vesicle is nonpalpable. The right seminal vesicle is nonpalpable. The perineum is normal on inspection.  Genitourinary: Examination of the penis demonstrates no discharge, no masses, no lesions and a normal meatus. The scrotum is without lesions. The right epididymis is palpably normal and non-tender. The left epididymis is palpably normal and non-tender. The right testis is non-tender and without masses. The left testis is non-tender and without masses.  Lymphatics: The femoral and inguinal nodes are not enlarged or tender.  Skin: Normal skin turgor, no visible rash and no visible skin lesions.  Neuro/Psych:. Mood and affect are appropriate.   Results/Data Urodynamics 09/10/15: Study was performed to evaluate urinary retention.  His full urodynamic study was reviewed in its entirety as noted in the chart.    CMG: He had a maximum cystometric capacity of 504 cc with instability occurring at 274 cc and a maximum unstable contraction pressure of 42 cm H2O with no leakage occurring.  Pressure-flow: He was able to generate a voluntary contraction and voided 86 cc with a maximum flow rate of 6 cc/second and a detrusor pressure at maximum flow of 60 cm H2O. His PVR was 418 cc.  EMG: Normal  Fluoroscopy: Trabeculation with an elevated bladder base.    Impression: He has a normal capacity bladder with some instability but urinary retention secondary to outlet obstruction. He would likely benefit from outlet resistance surgery.     Assessment   I went over the results of the urodynamic study today. We discussed each of the findings of the study and the normal/anticipated results as well as the significance of the results of this study in relation to the reported subjective symptoms.  I told him that with this essentially means is that his prostate is obstructing and  he does have a good, strong detrusor contraction. Because of that his options would be either to continue with Foley catheter and maximum medical management with intermittent voiding trials versus removal of his catheter and intermittent self-catheterization on maximum medical management as well. The third option would be a transurethral resection of his prostate. I therefore discussed this procedure with him in complete detail. We discussed how the procedure is performed, the probability of success, the risks and complications, the need for an overnight stay in the hospital and the anticipated postoperative course. 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 He understands, all of his questions and his wife's questions were answered to their satisfaction today and he is electing to proceed with surgery.   Plan   1. A urine culture will be obtained from his catheter one week prior to his planned surgery date for culture.  2. He will be scheduled for TURP.

## 2015-09-25 NOTE — Anesthesia Preprocedure Evaluation (Addendum)
Anesthesia Evaluation  Patient identified by MRN, date of birth, ID band Patient awake    Reviewed: Allergy & Precautions, NPO status , Patient's Chart, lab work & pertinent test results  Airway Mallampati: II  TM Distance: >3 FB Neck ROM: Full    Dental  (+) Teeth Intact, Dental Advisory Given   Pulmonary neg pulmonary ROS,    Pulmonary exam normal breath sounds clear to auscultation       Cardiovascular (-) hypertension+ CAD  (-) Past MI, (-) Cardiac Stents and (-) CHF Normal cardiovascular exam+ dysrhythmias (intermittent A-flutter)  Rhythm:Regular Rate:Normal  Echo 07/03/13: Study Conclusions  - Left ventricle: The cavity size was normal. There was mildconcentric hypertrophy. Systolic function was normal. Theestimated ejection fraction was in the range of 60% to65%. Wall motion was normal; there were no regional wallmotion abnormalities. - Aortic valve: Mild regurgitation. - Left atrium: The atrium was mildly dilated. - Pericardium, extracardiac: A trivial pericardial effusion was identified posterior to the heart.  LHC 10/31/11: 1. Unstable angina, likely due to plaque rupture in the fifth obtuse marginal branch. The vessel size and borderline obstruction suggested medical therapy should be the treatment of choice. 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 The right coronary could also be the source of the patient's pain. This vessel is also relatively small and is probably better treated with medical therapy. 2. Mild to moderate plaquing in the LAD proximal and mid segment 3. Normal LV function, with LVEF of 65%    Neuro/Psych negative neurological ROS  negative psych ROS   GI/Hepatic negative GI ROS, Neg liver ROS,   Endo/Other  negative endocrine ROS  Renal/GU negative Renal ROS   BPH with urinary retention     Musculoskeletal negative musculoskeletal ROS (+)    Abdominal   Peds  Hematology negative hematology ROS (+)   Anesthesia Other Findings Day of surgery medications reviewed with the patient.  Reproductive/Obstetrics                          Anesthesia Physical Anesthesia Plan  ASA: III  Anesthesia Plan: General   Post-op Pain Management:    Induction: Intravenous  Airway Management Planned: LMA  Additional Equipment:   Intra-op Plan:   Post-operative Plan: Extubation in OR  Informed Consent: I have reviewed the patients History and Physical, chart, labs and discussed the procedure including the risks, benefits and alternatives for the proposed anesthesia with the patient or authorized representative who has indicated his/her understanding and acceptance.   Dental advisory given  Plan Discussed with: CRNA  Anesthesia Plan Comments: (Risks/benefits of general anesthesia discussed with patient including risk of damage to teeth, lips, gum, and tongue, nausea/vomiting, allergic reactions to medications, and the possibility of heart attack, stroke and death.  All patient questions answered.  Patient wishes to proceed.)        Anesthesia Quick Evaluation

## 2015-09-25 NOTE — Op Note (Signed)
PATIENT:  Terry Snyder  PRE-OPERATIVE DIAGNOSIS: BPH with urinary retention  POST-OPERATIVE DIAGNOSIS: 1. BPH with urinary retention 2 bladder calculi  PROCEDURE: 1. TURP 2. Removal of bladder calculi  SURGEON:  [facility]  INDICATION: Terry Snyder is a  70 year old male who developed urinary retention. He was placed on maximum medical management and attempts at voiding trials were unsuccessful. We therefore discussed surgical management and he has elected to proceed with TURP.   ANESTHESIA:  General  EBL:  Minimal  DRAINS: A 20 French, three-way Foley catheter  SPECIMEN:  Prostate chips to pathology  After informed consent the patient was brought to the major OR and placed on the table. He was administered general anesthesia and then moved to the dorsal lithotomy position. His genitalia was sterilely prepped and draped and an official timeout was then performed.  Initially the 28 French resectoscope sheath with the visual obturator was passed into the bladder and the obturator removed. I then inserted the resectoscope element with 30 lens and  performed a systematic inspection of the bladder. I noted the bladder had 3+ trabeculation but was free of any tumor or inflammatory lesions. I noted multiple stones on the floor of his bladder. These were photographed. The ureteral orifices were noted to be of normal configuration and position. Withdrawing the scope into the prostatic urethra I noted obstructing bilobar hypertrophy with elongation of the prostatic urethra and a fairly large median lobe component.  I used the Microvasive evacuator to evacuate all of the stones from his bladder. I then reinspected the bladder and noted no further stones present within the bladder. I noted the ureteral orifices were well away from the bladder neck.  I then began resection.  I  first resected the median lobe in the midline down to the level of the bladder neck. I then began resecting the  left lobe of the prostate by resecting first from the level of the bladder neck back to the level of the Veru at the 5:00 position and then progressed in a counterclockwise direction resecting all of the adenomatous tissue of the left lobe down to the surgical capsule. Bleeding points were cauterized as they were encountered. I then turned my attention to the right lobe of the prostate and it was resected in an identical fashion. Tissue in the area of the apex was then resected circumferentially with care being taken to maintain the resection proximal to the Veru at all times. The prostatic chips were then flushed into the bladder and the Microvasive evacuator was then used to evacuate all chips from the bladder. Reinspection of the bladder revealed the mucosa to be intact, the ureteral orifices intact as well and well away from the bladder neck and area of resection. There were no prostatic chips remaining within the bladder. The prostatic capsule was intact throughout with no perforation and there was no active bleeding noted at the end of the procedure.  The resectoscope was therefore removed and the 20 French three-way Foley catheter was then inserted and balloon was filled to 30 cc. This was placed on mild traction and the bladder was irrigated with the irrigant returning clear. The catheter was then hooked to closed system drainage and continuous irrigation and the patient was awakened and taken to recovery room in stable and satisfactory condition. He tolerated the procedure well and there were no intraoperative complications.  PLAN OF CARE: Observation overnight with anticipated discharge in the morning.  PATIENT DISPOSITION:  PACU -  Hemodynamically stable.

## 2015-09-26 DIAGNOSIS — N401 Enlarged prostate with lower urinary tract symptoms: Secondary | ICD-10-CM | POA: Diagnosis not present

## 2015-09-26 NOTE — Anesthesia Postprocedure Evaluation (Signed)
Anesthesia Post Note  Patient: Terry Snyder  Procedure(s) Performed: Procedure(s) (LRB): TRANSURETHRAL RESECTION OF THE PROSTATE (TURP) WITH GYRUS (N/A)  Patient location during evaluation: PACU Anesthesia Type: General Level of consciousness: awake and alert Pain management: pain level controlled Vital Signs Assessment: post-procedure vital signs reviewed and stable Respiratory status: spontaneous breathing, nonlabored ventilation, respiratory function stable and patient connected to nasal cannula oxygen Cardiovascular status: blood pressure returned to baseline and stable Postop Assessment: no signs of nausea or vomiting Anesthetic complications: no    Last Vitals:  Filed Vitals:   09/26/15 0225 09/26/15 0634  BP: 127/72 127/69  Pulse: 64 72  Temp: 36.4 C 36.7 C  Resp: 16 14    Last Pain:  Filed Vitals:   09/26/15 0645  PainSc: 1                  Belky Mundo J

## 2015-09-26 NOTE — Discharge Summary (Signed)
Physician Discharge Summary  Patient ID: Terry Snyder MRN: [id number] DOB/AGE: [birth year] 68 y.o.  Admit date: 09/25/2015 Discharge date: 09/26/2015  Admission Diagnoses: BPH WITH RETENTION  Discharge Diagnoses:  Active Problems:   BPH (benign prostatic hypertrophy) with urinary retention  Present on Admission Past Medical History  Diagnosis Date  . Hyperlipidemia   . Chest pain   . CAD (coronary artery disease)   . ADHD (attention deficit hyperactivity disorder)   . Foley catheter in place changed last week  . Pneumonia 08-26-14    Discharged Condition: good  Hospital Course:  The patient was admitted following plan transurethral resection of the prostate in 09/25/2015. The procedure was uncomplicated and he tolerated it well. Transferred to the postsurgical floor postop day 0. Draining Foley urine was clear and did not require continuous bladder irrigation. Catheter was removed the morning of postop day 1 the patient was able to void, voided urine was thin,Haw[place]ian punch colored but clear. He voided 2 additional times volitionally with again thin urine without clots. His pain is well-controlled and he was tolerating a regular diet. Discharged on postoperative morning good condition voiding volitionally. Follow-up as scheduled with Dr. Vernie Ammons.  Significant Diagnostic Studies: No results found.  Discharge Exam: Blood pressure 127/69, pulse 72, temperature 98 F (36.7 C), temperature source Oral, resp. rate 14, height  (1.803 m), weight 86.637 kg (191 lb), SpO2 96 %.  Physical Exam:  Vital signs in last 24 hours: Temp:  [97.3 F (36.3 C)-98.2 F (36.8 C)] 98 F (36.7 C) (02/11 0634) Pulse Rate:  [51-81] 72 (02/11 0634) Resp:  [10-18] 14 (02/11 0634) BP: (114-137)/(69-84) 127/69 mmHg (02/11 0634) SpO2:  [95 %-100 %] 96 % (02/11 0634) Weight:  [86.637 kg (191 lb)] 86.637 kg (191 lb) (02/10 1036) Constitutional:  Alert and oriented, No acute distress Cardiovascular:  Regular rate and rhythm, No JVD Respiratory: Normal respiratory effort, SORA GI: Abdomen is soft, nontender, nondistended, no abdominal masses Genitourinary: No CVAT. No foley. Rectal: deferred Neurologic: Grossly intact, no focal deficits Psychiatric: Normal mood and affect   Disposition: 01-Home or Self Care     Medication List    STOP taking these medications        finasteride 5 MG tablet  Commonly known as:  PROSCAR     nitrofurantoin 100 MG capsule  Commonly known as:  MACRODANTIN      TAKE these medications        aspirin 81 MG tablet  Take 81 mg by mouth at bedtime.     atorvastatin 10 MG tablet  Commonly known as:  LIPITOR  Take 10 mg by mouth at bedtime.     diltiazem 30 MG tablet  Commonly known as:  CARDIZEM  Take 1 tablet every 4 hours AS NEEDED for elevated HR.     doxylamine (Sleep) 25 MG tablet  Commonly known as:  UNISOM  Take 12.5 mg by mouth at bedtime. Takes every night.     HYDROcodone-acetaminophen 10-325 MG tablet  Commonly known as:  NORCO  Take 1-2 tablets by mouth every 4 (four) hours as needed for moderate pain. Maximum dose per 24 hours - 8 pills     multivitamin with minerals Tabs tablet  Take 1 tablet by mouth daily.     naproxen sodium 220 MG tablet  Commonly known as:  ANAPROX  Take 220 mg by mouth daily as needed (back pain).     omega-3 acid ethyl esters 1 g capsule  Commonly known as:  LOVAZA  Take 1 capsule (1 g total) by mouth 2 (two) times daily.     phenazopyridine 200 MG tablet  Commonly known as:  PYRIDIUM  Take 1 tablet (200 mg total) by mouth 3 (three) times daily as needed for pain.           Follow-up Information    Follow up with Garnett Farm, MD On 10/09/2015.   Specialty:  Urology   Why:  For your appiontment at 11:00   Contact information:   [address] [place] [address] [phone]       Signed: Loura Pardon 09/26/2015, 9:23 AM   I have seen and examined the pateint and agree with  assesment and plan.  S: POD 1 s/p elective TURP  O: NAD Non-labored breathing Voiding mod pink urine w/o clots x several after foley out this AM No c/c/e  A/P DC Home. MD contact warnings discussed.

## 2015-11-18 ENCOUNTER — Other Ambulatory Visit (HOSPITAL_COMMUNITY): Payer: Self-pay | Admitting: Gastroenterology

## 2015-11-18 DIAGNOSIS — Z9889 Other specified postprocedural states: Secondary | ICD-10-CM

## 2015-11-19 ENCOUNTER — Ambulatory Visit (HOSPITAL_COMMUNITY)
Admission: RE | Admit: 2015-11-19 | Discharge: 2015-11-19 | Disposition: A | Discharge status: Home / Self Care | Payer: Medicare Other | Source: Ambulatory Visit | Attending: Gastroenterology | Admitting: Gastroenterology

## 2015-11-19 DIAGNOSIS — Z9889 Other specified postprocedural states: Secondary | ICD-10-CM

## 2015-11-19 DIAGNOSIS — Q438 Other specified congenital malformations of intestine: Secondary | ICD-10-CM | POA: Diagnosis not present

## 2015-12-25 ENCOUNTER — Other Ambulatory Visit: Payer: Self-pay

## 2015-12-25 MED ORDER — DILTIAZEM HCL 30 MG PO TABS: ORAL_TABLET | ORAL | Status: AC

## 2018-06-17 ENCOUNTER — Emergency Department (HOSPITAL_COMMUNITY): Payer: Medicare Other

## 2018-06-17 ENCOUNTER — Emergency Department (HOSPITAL_COMMUNITY)
Admission: EM | Admit: 2018-06-17 | Discharge: 2018-06-17 | Disposition: A | Discharge status: Home / Self Care | Payer: Medicare Other | Attending: Emergency Medicine | Admitting: Emergency Medicine

## 2018-06-17 ENCOUNTER — Other Ambulatory Visit: Payer: Self-pay

## 2018-06-17 ENCOUNTER — Encounter (HOSPITAL_COMMUNITY): Payer: Self-pay | Admitting: Emergency Medicine

## 2018-06-17 DIAGNOSIS — E785 Hyperlipidemia, unspecified: Secondary | ICD-10-CM | POA: Diagnosis not present

## 2018-06-17 DIAGNOSIS — I2510 Atherosclerotic heart disease of native coronary artery without angina pectoris: Secondary | ICD-10-CM | POA: Insufficient documentation

## 2018-06-17 DIAGNOSIS — Z79899 Other long term (current) drug therapy: Secondary | ICD-10-CM | POA: Diagnosis not present

## 2018-06-17 DIAGNOSIS — R079 Chest pain, unspecified: Secondary | ICD-10-CM | POA: Diagnosis not present

## 2018-06-17 LAB — BASIC METABOLIC PANEL
ANION GAP: 5 (ref 5–15)
BUN: 15 mg/dL (ref 8–23)
CO2: 28 mmol/L (ref 22–32)
Calcium: 8.8 mg/dL — ABNORMAL LOW (ref 8.9–10.3)
Chloride: 107 mmol/L (ref 98–111)
Creatinine, Ser: 1.18 mg/dL (ref 0.61–1.24)
GFR calc Af Amer: >60 mL/min (ref >60)
GFR calc non Af Amer: >60 mL/min (ref >60)
GLUCOSE: 94 mg/dL (ref 70–99)
POTASSIUM: 3.9 mmol/L (ref 3.5–5.1)
Sodium: 140 mmol/L (ref 135–145)

## 2018-06-17 LAB — I-STAT TROPONIN, ED
Troponin i, poc: 0 ng/mL (ref 0.00–0.08)
Troponin i, poc: 0.01 ng/mL (ref 0.00–0.08)

## 2018-06-17 LAB — CBC
HCT: 43.9 % (ref 39.0–52.0)
HEMOGLOBIN: 14.5 g/dL (ref 13.0–17.0)
MCH: 30 pg (ref 26.0–34.0)
MCHC: 33 g/dL (ref 30.0–36.0)
MCV: 90.7 fL (ref 80.0–100.0)
PLATELETS: 200 10*3/uL (ref 150–400)
RBC: 4.84 MIL/uL (ref 4.22–5.81)
RDW: 12.3 % (ref 11.5–15.5)
WBC: 8.1 10*3/uL (ref 4.0–10.5)
nRBC: 0 % (ref 0.0–0.2)

## 2018-06-17 MED ORDER — ASPIRIN 81 MG PO CHEW
324.0000 mg | CHEWABLE_TABLET | Freq: Once | ORAL | Status: AC
  Administered 2018-06-17: 324 mg via ORAL
  Filled 2018-06-17: qty 4

## 2018-06-17 NOTE — Discharge Instructions (Signed)
Please call your cardiologist tomorrow to let them know of your visit to the ED today.  If you have any continued chest pain, shortness of breath, sweatiness, lightheadedness or dizziness then you should return to the emergency department immediately.

## 2018-06-17 NOTE — ED Provider Notes (Signed)
[facility] EMERGENCY DEPARTMENT Provider Note   CSN: [id number] Arrival date & time: 06/17/18  1612     History   Chief Complaint Chief Complaint  Patient presents with  . Chest Pain    HPI Terry Snyder is a 72 y.o. male.  HPI   Patient is a 72 year old male with a history of ADHD, CAD, afib (s/p ablation), hyperlipidemia, who presents emergency department today for evaluation of left-sided chest tightness that began just prior to arrival.  Patient states he was driving in the car when he began to have a feeling of tightness/pressure in the center of his chest.  Denies pain.  This episode was associated with tingling to the left arm and the left face which is since resolved.  Patient reports associated diaphoresis, but denies nausea, vomiting or shortness of breath.  Denies any recent lower extremity swelling, leg pain, abdominal pain symptoms, fevers or chills.  Denies a history of hypertension, diabetes or smoking.  Past Medical History:  Diagnosis Date  . ADHD (attention deficit hyperactivity disorder)   . CAD (coronary artery disease)   . Chest pain   . Foley catheter in place changed last week  . Hyperlipidemia   . Pneumonia 08-26-14    Patient Active Problem List   Diagnosis Date Noted  . BPH (benign prostatic hypertrophy) with urinary retention 09/25/2015  . Atrial flutter (HCC) 06/19/2013    Class: Chronic  . Hyperlipidemia LDL goal < 70 11/01/2011  . CAD (coronary artery disease) 10/31/2011    Class: Acute    Past Surgical History:  Procedure Laterality Date  . CARDIAC CATHETERIZATION     Unstable angina- cath-EF 65percent, plaque rupture 5th obtuse marginal, fixed stenosis non dominant right CA, mild plaquing LAD-medical therpay recommended, Dr. Katrinka Blazing, 3/13  . HERNIA REPAIR    . LEFT HEART CATHETERIZATION WITH CORONARY ANGIOGRAM N/A 10/31/2011   Procedure: LEFT HEART CATHETERIZATION WITH CORONARY ANGIOGRAM;  Surgeon: Lesleigh Noe,  MD;  Location: [facility] CATH LAB;  Service: Cardiovascular;  Laterality: N/A;  . TRANSURETHRAL RESECTION OF PROSTATE N/A 09/25/2015   Procedure: TRANSURETHRAL RESECTION OF THE PROSTATE (TURP) WITH GYRUS;  Surgeon: Ihor Gully, MD;  Location: [facility] ORS;  Service: Urology;  Laterality: N/A;        Home Medications    Prior to Admission medications   Medication Sig Start Date End Date Taking? Authorizing Provider  atorvastatin (LIPITOR) 10 MG tablet Take 10 mg by mouth at bedtime.   Yes [provider]  doxylamine, Sleep, (UNISOM) 25 MG tablet Take 12.5 mg by mouth at bedtime.    Yes [provider]  ibuprofen (ADVIL,MOTRIN) 200 MG tablet Take 200 mg by mouth daily as needed for headache (pain).   Yes [provider]  Melatonin 5 MG TABS Take 5 mg by mouth at bedtime as needed (sleep).   Yes [provider]  Multiple Vitamin (MULITIVITAMIN WITH MINERALS) TABS Take 1 tablet by mouth at bedtime.    Yes [provider]  diltiazem (CARDIZEM) 30 MG tablet Take 1 tablet every 4 hours AS NEEDED for elevated HR. Patient not taking: Reported on 06/17/2018 12/25/15   Newman Nip, NP  HYDROcodone-acetaminophen [facility]) 10-325 MG tablet Take 1-2 tablets by mouth every 4 (four) hours as needed for moderate pain. Maximum dose per 24 hours - 8 pills Patient not taking: Reported on 06/17/2018 09/25/15   Ihor Gully, MD  omega-3 acid ethyl esters (LOVAZA) 1 G capsule Take 1 capsule (1  g total) by mouth 2 (two) times daily. Patient not taking: Reported on 06/17/2018 11/01/11   Lyn Records, MD  phenazopyridine (PYRIDIUM) 200 MG tablet Take 1 tablet (200 mg total) by mouth 3 (three) times daily as needed for pain. Patient not taking: Reported on 06/17/2018 09/25/15   Ihor Gully, MD    Family History No family history on file.  Social History Social History   Tobacco Use  . Smoking status: Never Smoker  . Smokeless tobacco: Never Used  Substance Use Topics  .  Alcohol use: Yes    Comment: occasional  . Drug use: No     Allergies   Flexeril [cyclobenzaprine hcl] and Robaxin [methocarbamol]   Review of Systems Review of Systems  Constitutional: Positive for diaphoresis. Negative for fever.  HENT: Negative for congestion.   Eyes: Negative for visual disturbance.  Respiratory: Negative for cough, shortness of breath and wheezing.   Cardiovascular: Positive for chest pain. Negative for palpitations and leg swelling.  Gastrointestinal: Negative for abdominal pain, constipation, diarrhea, nausea and vomiting.  Genitourinary: Negative for flank pain.  Musculoskeletal: Negative for back pain.  Skin: Negative for rash.  Neurological: Negative for dizziness, weakness, light-headedness, numbness and headaches.       Tingling to left arm and face   Physical Exam Updated Vital Signs BP 130/76   Pulse (!) 57   Temp 98.1 F (36.7 C) (Oral)   Resp 17   Ht 6' (1.829 m)   Wt 80.7 kg   SpO2 95%   BMI 24.14 kg/m   Physical Exam  Constitutional: He appears well-developed and well-nourished.  Non-toxic appearance. He does not appear ill. No distress.  HENT:  Head: Normocephalic and atraumatic.  Eyes: Conjunctivae are normal.  Neck: Neck supple.  Cardiovascular: Normal rate and regular rhythm.  No murmur heard. Pulmonary/Chest: Effort normal and breath sounds normal. No respiratory distress. He has no decreased breath sounds. He has no wheezes. He has no rhonchi. He has no rales.  Abdominal: Soft. Bowel sounds are normal. He exhibits no distension. There is no tenderness.  Musculoskeletal: He exhibits no edema.       Right lower leg: Normal. He exhibits no tenderness and no edema.       Left lower leg: Normal. He exhibits no tenderness and no edema.  Neurological: He is alert.  Skin: Skin is warm and dry.  Psychiatric: He has a normal mood and affect.  Nursing note and vitals reviewed.    ED Treatments / Results  Labs (all labs ordered  are listed, but only abnormal results are displayed) Labs Reviewed  BASIC METABOLIC PANEL - Abnormal; Notable for the following components:      Result Value   Calcium 8.8 (*)    All other components within normal limits  CBC  I-STAT TROPONIN, ED  I-STAT TROPONIN, ED    EKG EKG Interpretation  Date/Time:  Sunday June 17 2018 16:17:24 EST Ventricular Rate:  63 PR Interval:  168 QRS Duration: 82 QT Interval:  426 QTC Calculation: 435 R Axis:   -63 Text Interpretation:  Normal sinus rhythm Left anterior fascicular block Possible Anterior infarct , age undetermined Abnormal ECG no significant change since 2016 Confirmed by Pricilla Loveless [phone]) on 06/17/2018 4:37:36 PM   EKG Interpretation  Date/Time:  Sunday June 17 2018 20:49:58 EST Ventricular Rate:  55 PR Interval:  168 QRS Duration: 102 QT Interval:  459 QTC Calculation: 439 R Axis:   -69 Text Interpretation:  Sinus rhythm  Left anterior fascicular block Low voltage, precordial leads no significant change since earlier in the day Confirmed by Pricilla Loveless [phone]) on 06/17/2018 9:00:16 PM         Radiology Dg Chest 2 View  Result Date: 06/17/2018 CLINICAL DATA:  Chest tightness EXAM: CHEST - 2 VIEW COMPARISON:  08/26/2014 FINDINGS: The heart size and mediastinal contours are within normal limits. Both lungs are clear. The visualized skeletal structures show degenerative changes of the thoracic spine. IMPRESSION: No active cardiopulmonary disease. Electronically Signed   By: Alcide Clever M.D.   On: 06/17/2018 17:09    Procedures Procedures (including critical care time)  Medications Ordered in ED Medications  aspirin chewable tablet 324 mg (324 mg Oral Given 06/17/18 1849)     Initial Impression / Assessment and Plan / ED Course  I have reviewed the triage vital signs and the nursing notes.  Pertinent labs & imaging results that were available during my care of the patient were reviewed by me and  considered in my medical decision making (see chart for details).   Final Clinical Impressions(s) / ED Diagnoses   Final diagnoses:  Chest pain, unspecified type   Pt presenting with left sided chest tightness associated with diaphoresis, left arm and facial tingling. Pain has resolved.  His chest pain is certainly concerning especially given his heart history however patient is from out of town and he does not want to be admitted to the hospital and would rather follow-up with his cardiologist at home.  He is in agreement to stay at least for delta troponins.  Initial troponin was negative.  Electrolytes grossly normal.  Initial EKG with normal sinus rhythm, unchanged from prior EKG in 2016.  Repeat troponin negative and repeat EKG also unchanged.  Chest x-ray negative for pneumonia, pneumothorax or widened mediastinum.  Results were discussed with the patient and his wife at bedside.  I recommended that he come into the hospital for further evaluation of his chest pain and he declined.  Dr. Criss Alvine also evaluated the patient and recommended admission and patient declined.  Patient is aware of the risks of leaving.  He was advised to call his cardiologist first thing in the morning tomorrow to schedule a follow-up appointment.  He was further advised that if he has any chest pain, shortness of breath, sweating or other symptoms that he needs to go to his nearest emergency department.  Him and his wife at bedside voices understanding of the plan and reasons to return to the ED immediately.  All questions were answered.  ED Discharge Orders    None       Rayne Du 06/17/18 2138    Pricilla Loveless, MD 06/22/18 [phone]

## 2018-06-17 NOTE — ED Notes (Signed)
Patient verbalizes understanding of discharge instructions. Opportunity for questioning and answers were provided. Ambulatory at discharge in NAD.  

## 2018-06-17 NOTE — ED Triage Notes (Signed)
C/o chest tightness to center of chest with tingling to left arm and left face x 20 min.  Tightness started while driving.  Also reports mild nausea.  Denies SOB.
